# Patient Record
Sex: Male | Born: 1955
Health system: Southern US, Community
[De-identification: ages and names within clinical notes are randomized; demographics above are authoritative.]

## PROBLEM LIST (undated history)

## (undated) DIAGNOSIS — L309 Dermatitis, unspecified: Secondary | ICD-10-CM

## (undated) DIAGNOSIS — I429 Cardiomyopathy, unspecified: Secondary | ICD-10-CM

## (undated) DIAGNOSIS — I10 Essential (primary) hypertension: Secondary | ICD-10-CM

## (undated) DIAGNOSIS — I2510 Atherosclerotic heart disease of native coronary artery without angina pectoris: Secondary | ICD-10-CM

## (undated) DIAGNOSIS — M1990 Unspecified osteoarthritis, unspecified site: Secondary | ICD-10-CM

## (undated) HISTORY — PX: HAND SURGERY: SHX662

## (undated) HISTORY — PX: KNEE SURGERY: SHX244

## (undated) HISTORY — DX: Atherosclerotic heart disease of native coronary artery without angina pectoris: I25.10

## (undated) HISTORY — DX: Cardiomyopathy, unspecified: I42.9

---

## 2002-10-27 ENCOUNTER — Ambulatory Visit (HOSPITAL_COMMUNITY): Admission: RE | Admit: 2002-10-27 | Discharge: 2002-10-27 | Payer: Self-pay | Admitting: Orthopedic Surgery

## 2002-10-27 ENCOUNTER — Encounter: Payer: Self-pay | Admitting: Orthopedic Surgery

## 2004-02-18 ENCOUNTER — Ambulatory Visit (HOSPITAL_COMMUNITY): Admission: RE | Admit: 2004-02-18 | Discharge: 2004-02-18 | Payer: Self-pay | Admitting: Internal Medicine

## 2004-02-22 ENCOUNTER — Ambulatory Visit (HOSPITAL_COMMUNITY): Admission: RE | Admit: 2004-02-22 | Discharge: 2004-02-22 | Payer: Self-pay | Admitting: Internal Medicine

## 2019-12-26 ENCOUNTER — Other Ambulatory Visit: Payer: Self-pay

## 2019-12-26 ENCOUNTER — Ambulatory Visit: Payer: Medicare Other | Attending: Internal Medicine

## 2019-12-26 DIAGNOSIS — Z20822 Contact with and (suspected) exposure to covid-19: Secondary | ICD-10-CM | POA: Insufficient documentation

## 2019-12-27 LAB — NOVEL CORONAVIRUS, NAA: SARS-CoV-2, NAA: NOT DETECTED

## 2019-12-29 ENCOUNTER — Other Ambulatory Visit: Payer: Self-pay

## 2019-12-29 ENCOUNTER — Inpatient Hospital Stay (HOSPITAL_COMMUNITY)
Admission: EM | Admit: 2019-12-29 | Discharge: 2020-01-01 | DRG: 308 | Disposition: A | Discharge status: Home / Self Care | Payer: Medicare Other | Source: Ambulatory Visit | Attending: Internal Medicine | Admitting: Internal Medicine

## 2019-12-29 ENCOUNTER — Emergency Department (HOSPITAL_COMMUNITY): Payer: Medicare Other

## 2019-12-29 ENCOUNTER — Encounter (HOSPITAL_COMMUNITY): Payer: Self-pay | Admitting: Emergency Medicine

## 2019-12-29 DIAGNOSIS — I4892 Unspecified atrial flutter: Secondary | ICD-10-CM | POA: Diagnosis present

## 2019-12-29 DIAGNOSIS — IMO0002 Reserved for concepts with insufficient information to code with codable children: Secondary | ICD-10-CM

## 2019-12-29 DIAGNOSIS — I10 Essential (primary) hypertension: Secondary | ICD-10-CM

## 2019-12-29 DIAGNOSIS — Q211 Atrial septal defect: Secondary | ICD-10-CM

## 2019-12-29 DIAGNOSIS — Z79899 Other long term (current) drug therapy: Secondary | ICD-10-CM

## 2019-12-29 DIAGNOSIS — R918 Other nonspecific abnormal finding of lung field: Secondary | ICD-10-CM | POA: Diagnosis present

## 2019-12-29 DIAGNOSIS — L309 Dermatitis, unspecified: Secondary | ICD-10-CM | POA: Diagnosis present

## 2019-12-29 DIAGNOSIS — I110 Hypertensive heart disease with heart failure: Secondary | ICD-10-CM | POA: Diagnosis present

## 2019-12-29 DIAGNOSIS — I429 Cardiomyopathy, unspecified: Secondary | ICD-10-CM | POA: Diagnosis present

## 2019-12-29 DIAGNOSIS — Z20822 Contact with and (suspected) exposure to covid-19: Secondary | ICD-10-CM | POA: Diagnosis present

## 2019-12-29 DIAGNOSIS — Z8249 Family history of ischemic heart disease and other diseases of the circulatory system: Secondary | ICD-10-CM

## 2019-12-29 DIAGNOSIS — Z6841 Body Mass Index (BMI) 40.0 and over, adult: Secondary | ICD-10-CM

## 2019-12-29 DIAGNOSIS — I2510 Atherosclerotic heart disease of native coronary artery without angina pectoris: Secondary | ICD-10-CM | POA: Diagnosis present

## 2019-12-29 DIAGNOSIS — I483 Typical atrial flutter: Secondary | ICD-10-CM | POA: Diagnosis not present

## 2019-12-29 DIAGNOSIS — Z87891 Personal history of nicotine dependence: Secondary | ICD-10-CM

## 2019-12-29 DIAGNOSIS — I4891 Unspecified atrial fibrillation: Secondary | ICD-10-CM

## 2019-12-29 DIAGNOSIS — I5021 Acute systolic (congestive) heart failure: Secondary | ICD-10-CM | POA: Diagnosis present

## 2019-12-29 DIAGNOSIS — I700 Atherosclerosis of aorta: Secondary | ICD-10-CM | POA: Diagnosis present

## 2019-12-29 DIAGNOSIS — I340 Nonrheumatic mitral (valve) insufficiency: Secondary | ICD-10-CM | POA: Diagnosis present

## 2019-12-29 HISTORY — DX: Unspecified osteoarthritis, unspecified site: M19.90

## 2019-12-29 HISTORY — DX: Essential (primary) hypertension: I10

## 2019-12-29 HISTORY — DX: Dermatitis, unspecified: L30.9

## 2019-12-29 LAB — BRAIN NATRIURETIC PEPTIDE: B Natriuretic Peptide: 34 pg/mL (ref 0.0–100.0)

## 2019-12-29 LAB — HEPATIC FUNCTION PANEL
ALT: 37 U/L (ref 0–44)
AST: 36 U/L (ref 15–41)
Albumin: 4.1 g/dL (ref 3.5–5.0)
Alkaline Phosphatase: 51 U/L (ref 38–126)
Bilirubin, Direct: 0.2 mg/dL (ref 0.0–0.2)
Indirect Bilirubin: 0.7 mg/dL (ref 0.3–0.9)
Total Bilirubin: 0.9 mg/dL (ref 0.3–1.2)
Total Protein: 7.1 g/dL (ref 6.5–8.1)

## 2019-12-29 LAB — CBC
HCT: 43.7 % (ref 39.0–52.0)
Hemoglobin: 13.9 g/dL (ref 13.0–17.0)
MCH: 29.6 pg (ref 26.0–34.0)
MCHC: 31.8 g/dL (ref 30.0–36.0)
MCV: 93.2 fL (ref 80.0–100.0)
Platelets: 271 10*3/uL (ref 150–400)
RBC: 4.69 MIL/uL (ref 4.22–5.81)
RDW: 13.2 % (ref 11.5–15.5)
WBC: 9.6 10*3/uL (ref 4.0–10.5)
nRBC: 0 % (ref 0.0–0.2)

## 2019-12-29 LAB — BASIC METABOLIC PANEL
Anion gap: 9 (ref 5–15)
BUN: 16 mg/dL (ref 8–23)
CO2: 23 mmol/L (ref 22–32)
Calcium: 9.4 mg/dL (ref 8.9–10.3)
Chloride: 105 mmol/L (ref 98–111)
Creatinine, Ser: 0.9 mg/dL (ref 0.61–1.24)
GFR calc Af Amer: >60 mL/min (ref >60)
GFR calc non Af Amer: >60 mL/min (ref >60)
Glucose, Bld: 105 mg/dL — ABNORMAL HIGH (ref 70–99)
Potassium: 4.5 mmol/L (ref 3.5–5.1)
Sodium: 137 mmol/L (ref 135–145)

## 2019-12-29 LAB — D-DIMER, QUANTITATIVE: D-Dimer, Quant: 1.47 ug/mL-FEU — ABNORMAL HIGH (ref 0.00–0.50)

## 2019-12-29 LAB — RESPIRATORY PANEL BY RT PCR (FLU A&B, COVID)
Influenza A by PCR: NEGATIVE
Influenza B by PCR: NEGATIVE
SARS Coronavirus 2 by RT PCR: NEGATIVE

## 2019-12-29 LAB — TROPONIN I (HIGH SENSITIVITY)
Troponin I (High Sensitivity): 19 ng/L — ABNORMAL HIGH (ref <18)
Troponin I (High Sensitivity): 26 ng/L — ABNORMAL HIGH (ref <18)

## 2019-12-29 MED ORDER — SODIUM CHLORIDE 0.9 % IV SOLN: 250.0000 mL | INTRAVENOUS | Status: DC | PRN

## 2019-12-29 MED ORDER — SODIUM CHLORIDE 0.9% FLUSH: 3.0000 mL | INTRAVENOUS | Status: DC | PRN

## 2019-12-29 MED ORDER — ACETAMINOPHEN 325 MG PO TABS: 650.0000 mg | ORAL_TABLET | Freq: Four times a day (QID) | ORAL | Status: DC | PRN

## 2019-12-29 MED ORDER — ACETAMINOPHEN 650 MG RE SUPP: 650.0000 mg | Freq: Four times a day (QID) | RECTAL | Status: DC | PRN

## 2019-12-29 MED ORDER — SODIUM CHLORIDE 0.9% FLUSH
3.0000 mL | Freq: Two times a day (BID) | INTRAVENOUS | Status: DC
  Administered 2019-12-30 – 2019-12-31 (×4): 3 mL via INTRAVENOUS

## 2019-12-29 MED ORDER — METOPROLOL TARTRATE 5 MG/5ML IV SOLN
5.0000 mg | Freq: Once | INTRAVENOUS | Status: AC
  Administered 2019-12-29: 5 mg via INTRAVENOUS
  Filled 2019-12-29: qty 5

## 2019-12-29 MED ORDER — DILTIAZEM HCL 25 MG/5ML IV SOLN
10.0000 mg | Freq: Once | INTRAVENOUS | Status: AC
  Administered 2019-12-29: 10 mg via INTRAVENOUS

## 2019-12-29 MED ORDER — DILTIAZEM HCL-DEXTROSE 125-5 MG/125ML-% IV SOLN (PREMIX)
5.0000 mg/h | INTRAVENOUS | Status: DC
  Administered 2019-12-29: 5 mg/h via INTRAVENOUS
  Administered 2019-12-30 – 2019-12-31 (×4): 15 mg/h via INTRAVENOUS
  Filled 2019-12-29 (×6): qty 125

## 2019-12-29 MED ORDER — DILTIAZEM LOAD VIA INFUSION
10.0000 mg | Freq: Once | INTRAVENOUS | Status: AC
  Administered 2019-12-29: 10 mg via INTRAVENOUS
  Filled 2019-12-29: qty 10

## 2019-12-29 MED ORDER — DILTIAZEM HCL-DEXTROSE 125-5 MG/125ML-% IV SOLN (PREMIX)
5.0000 mg/h | INTRAVENOUS | Status: DC
  Administered 2019-12-29: 22:00:00 5 mg/h via INTRAVENOUS
  Filled 2019-12-29: qty 125

## 2019-12-29 MED ORDER — METOPROLOL TARTRATE 5 MG/5ML IV SOLN
5.0000 mg | Freq: Once | INTRAVENOUS | Status: AC
  Administered 2019-12-29: 22:00:00 5 mg via INTRAVENOUS
  Filled 2019-12-29: qty 5

## 2019-12-29 NOTE — ED Notes (Signed)
Patient transported to X-ray 

## 2019-12-29 NOTE — ED Notes (Signed)
Dr Estell Harpin made aware of pt vital signs. Will hold cardizem at this time. Tonya, NT to get portable ECG

## 2019-12-29 NOTE — ED Triage Notes (Signed)
Pt denies chest pain but reports elevated heart rate x28 days. Pt reports shortness of breath with exertion. Pt sent over from pcp office for elevated heart rate and new onset atrial flutter.

## 2019-12-29 NOTE — H&P (Signed)
TRH H&P    Patient Demographics:    Danny Mccormick, is a 64 y.o. male  MRN: [id number]  DOB - January 26, 1956  Admit Date - 12/29/2019  Referring MD/NP/PA:  Catalina Pizza  Outpatient Primary MD for the patient is Asencion Noble, MD  Patient coming from:  home  Chief complaint- Aflutter   HPI:    Danny Mccormick  is a 64 y.o. male,  w hypertension, apparently presented to pcp for elevated hr x 4 weeks, and slightly dyspnea with exertion, and found to be in [place] at 140 and sent to ER for evaluation. Pt denies fever, chills, cough, cp, palp, n/v, abd pain, diarrhea, brbpr, black stool, dysuria.    In ED  T 99.2, P 139, R 24, Bp 169/123, pox 99% on RA Wt 127kg  CXR IMPRESSION: 1. Several small nodular densities in the lungs. These are nonspecific and could be of infectious or inflammatory etiology. However, the possibility of metastatic disease is not excluded. At the very least, follow-up standing PA and lateral chest radiograph is recommended in 2-3 weeks to ensure the stability or resolution of these findings. If of immediate clinical concern, this could be further evaluated with chest CT.  Na 137, K 4.5,  Bun 16, Creatinine 0.90 Ast 36, Alt 37  Wbc 9.6, HGb 13.9, Plt 271 Trop 19 BNP 34  Sars coronavirus 2 pcr negative  Pt will be admitted for Aflutter with RVR, and elevated troponin, and abnormal CXR     Review of systems:    In addition to the HPI above,  No Fever-chills, No Headache, No changes with Vision or hearing, No problems swallowing food or Liquids, No Chest pain, No Cough , No Abdominal pain, No Nausea or Vomiting, bowel movements are regular, No Blood in stool or Urine, No dysuria, No new skin rashes or bruises, No new joints pains-aches,  No new weakness, tingling, numbness in any extremity, No recent weight gain or loss, No polyuria, polydypsia or polyphagia, No  significant Mental Stressors.  All other systems reviewed and are negative.    Past History of the following :    Past Medical History:  Diagnosis Date  . Arthritis   . Eczema   . Hypertension       Past Surgical History:  Procedure Laterality Date  . KNEE SURGERY        Social History:      Social History   Tobacco Use  . Smoking status: Former Research scientist (life sciences)  . Smokeless tobacco: Never Used  Substance Use Topics  . Alcohol use: Yes    Comment: occ       Family History :    History reviewed. No pertinent family history.  pt can't recall any health problems with mother or father.    Home Medications:   Prior to Admission medications   Not on File     Allergies:    Not on File   Physical Exam:   Vitals  Blood pressure 111/86, pulse 63, temperature 99.2 F (37.3 C), temperature source Oral, resp.  rate (!) 22, height 5' 6.75" (1.695 m), weight 127 kg, SpO2 96 %.  1.  General: axoxo3  2. Psychiatric: Euthymic  3. Neurologic: Cn 2-12 intact, reflexes 2+ symmetric, direct, consensual, near intact, motor 5/5 in all 4 ext  4. HEENMT:  Anicteric, pupils 1.74mm symmetric, direct, consensual intact Neck: no jvd  5. Respiratory : CTAB  6. Cardiovascular : Tachy s1, s2,   7. Gastrointestinal:  Abd: soft, nt, nd, +bs  8. Skin:  Ext: no c/c/e, no rash  9.Musculoskeletal:  Good ROM    Data Review:    CBC Recent Labs  Lab 12/29/19 1643  WBC 9.6  HGB 13.9  HCT 43.7  PLT 271  MCV 93.2  MCH 29.6  MCHC 31.8  RDW 13.2   ------------------------------------------------------------------------------------------------------------------  Results for orders placed or performed during the hospital encounter of 12/29/19 (from the past 48 hour(s))  Basic metabolic panel     Status: Abnormal   Collection Time: 12/29/19  4:43 PM  Result Value Ref Range   Sodium 137 135 - 145 mmol/L   Potassium 4.5 3.5 - 5.1 mmol/L   Chloride 105 98 - 111 mmol/L    CO2 23 22 - 32 mmol/L   Glucose, Bld 105 (H) 70 - 99 mg/dL   BUN 16 8 - 23 mg/dL   Creatinine, Ser 6.43 0.61 - 1.24 mg/dL   Calcium 9.4 8.9 - 32.9 mg/dL   GFR calc non Af Amer >60 >60 mL/min   GFR calc Af Amer >60 >60 mL/min   Anion gap 9 5 - 15    Comment: Performed at [facility], [address] [place], [place] [address]  CBC     Status: None   Collection Time: 12/29/19  4:43 PM  Result Value Ref Range   WBC 9.6 4.0 - 10.5 K/uL   RBC 4.69 4.22 - 5.81 MIL/uL   Hemoglobin 13.9 13.0 - 17.0 g/dL   HCT 16.6 06.3 - 01.6 %   MCV 93.2 80.0 - 100.0 fL   MCH 29.6 26.0 - 34.0 pg   MCHC 31.8 30.0 - 36.0 g/dL   RDW 01.0 93.2 - 35.5 %   Platelets 271 150 - 400 K/uL   nRBC 0.0 0.0 - 0.2 %    Comment: Performed at [facility], [address], [place], [place] [address]  Troponin I (High Sensitivity)     Status: Abnormal   Collection Time: 12/29/19  4:43 PM  Result Value Ref Range   Troponin I (High Sensitivity) 19 (H) <18 ng/L    Comment: (NOTE) Elevated high sensitivity troponin I (hsTnI) values and significant  changes across serial measurements may suggest ACS but many other  chronic and acute conditions are known to elevate hsTnI results.  Refer to the "Links" section for chest pain algorithms and additional  guidance. Performed at [facility], [address] [place], [place] [address]   Brain natriuretic peptide     Status: None   Collection Time: 12/29/19  4:43 PM  Result Value Ref Range   B Natriuretic Peptide 34.0 0.0 - 100.0 pg/mL    Comment: Performed at [facility], [address], [place], [place] [address]  Hepatic function panel     Status: None   Collection Time: 12/29/19  4:43 PM  Result Value Ref Range   Total Protein 7.1 6.5 - 8.1 g/dL   Albumin 4.1 3.5 - 5.0 g/dL   AST 36 15 - 41 U/L   ALT 37 0 - 44  U/L   Alkaline Phosphatase 51 38 - 126 U/L   Total Bilirubin 0.9 0.3 - 1.2 mg/dL   Bilirubin, Direct 0.2 0.0 - 0.2 mg/dL   Indirect Bilirubin 0.7 0.3 - 0.9 mg/dL      Comment: Performed at [facility], [address] [place], [place] [address]  D-dimer, quantitative (not at [facility])     Status: Abnormal   Collection Time: 12/29/19  4:43 PM  Result Value Ref Range   D-Dimer, Quant 1.47 (H) 0.00 - 0.50 ug/mL-FEU    Comment: (NOTE) At the manufacturer cut-off of 0.50 ug/mL FEU, this assay has been documented to exclude PE with a sensitivity and negative predictive value of 97 to 99%.  At this time, this assay has not been approved by the FDA to exclude DVT/VTE. Results should be correlated with clinical presentation. Performed at [facility], [address] [place], [place] [address]   Respiratory Panel by RT PCR (Flu A&B, Covid) - Nasopharyngeal Swab     Status: None   Collection Time: 12/29/19  9:32 PM   Specimen: Nasopharyngeal Swab  Result Value Ref Range   SARS Coronavirus 2 by RT PCR NEGATIVE NEGATIVE    Comment: (NOTE) SARS-CoV-2 target nucleic acids are NOT DETECTED. The SARS-CoV-2 RNA is generally detectable in upper respiratoy specimens during the acute phase of infection. The lowest concentration of SARS-CoV-2 viral copies this assay can detect is 131 copies/mL. A negative result does not preclude SARS-Cov-2 infection and should not be used as the sole basis for treatment or other patient management decisions. A negative result may occur with  improper specimen collection/handling, submission of specimen other than nasopharyngeal swab, presence of viral mutation(s) within the areas targeted by this assay, and inadequate number of viral copies (<131 copies/mL). A negative result must be combined with clinical observations, patient history, and epidemiological information. The expected result is Negative. Fact Sheet for Patients:  https://www.moore.com/https://www.fda.gov/media/142436/download Fact Sheet for Healthcare Providers:  https://www.young.biz/https://www.fda.gov/media/142435/download This test is not yet ap proved or cleared by the [place] FDA and  has been  authorized for detection and/or diagnosis of SARS-CoV-2 by FDA under an Emergency Use Authorization (EUA). This EUA will remain  in effect (meaning this test can be used) for the duration of the COVID-19 declaration under Section 564(b)(1) of the Act, 21 U.S.C. section 360bbb-3(b)(1), unless the authorization is terminated or revoked sooner.    Influenza A by PCR NEGATIVE NEGATIVE   Influenza B by PCR NEGATIVE NEGATIVE    Comment: (NOTE) The Xpert Xpress SARS-CoV-2/FLU/RSV assay is intended as an aid in  the diagnosis of influenza from Nasopharyngeal swab specimens and  should not be used as a sole basis for treatment. Nasal washings and  aspirates are unacceptable for Xpert Xpress SARS-CoV-2/FLU/RSV  testing. Fact Sheet for Patients: https://www.moore.com/https://www.fda.gov/media/142436/download Fact Sheet for Healthcare Providers: https://www.young.biz/https://www.fda.gov/media/142435/download This test is not yet approved or cleared by the [place] FDA and  has been authorized for detection and/or diagnosis of SARS-CoV-2 by  FDA under an Emergency Use Authorization (EUA). This EUA will remain  in effect (meaning this test can be used) for the duration of the  Covid-19 declaration under Section 564(b)(1) of the Act, 21  U.S.C. section 360bbb-3(b)(1), unless the authorization is  terminated or revoked. Performed at [facility], [address] [place], [place] [address]   Troponin I (High Sensitivity)     Status: Abnormal   Collection Time: 12/29/19 10:17 PM  Result Value Ref Range   Troponin I (High Sensitivity)  26 (H) <18 ng/L    Comment: (NOTE) Elevated high sensitivity troponin I (hsTnI) values and significant  changes across serial measurements may suggest ACS but many other  chronic and acute conditions are known to elevate hsTnI results.  Refer to the "Links" section for chest pain algorithms and additional  guidance. Performed at [facility], [address] [place], [place] [address]     Chemistries    Recent Labs  Lab 12/29/19 1643  NA 137  K 4.5  CL 105  CO2 23  GLUCOSE 105*  BUN 16  CREATININE 0.90  CALCIUM 9.4  AST 36  ALT 37  ALKPHOS 51  BILITOT 0.9   ------------------------------------------------------------------------------------------------------------------  ------------------------------------------------------------------------------------------------------------------ GFR: Estimated Creatinine Clearance: 104.3 mL/min (by C-G formula based on SCr of 0.9 mg/dL). Liver Function Tests: Recent Labs  Lab 12/29/19 1643  AST 36  ALT 37  ALKPHOS 51  BILITOT 0.9  PROT 7.1  ALBUMIN 4.1   No results for input(s): LIPASE, AMYLASE in the last 168 hours. No results for input(s): AMMONIA in the last 168 hours. Coagulation Profile: No results for input(s): INR, PROTIME in the last 168 hours. Cardiac Enzymes: No results for input(s): CKTOTAL, CKMB, CKMBINDEX, TROPONINI in the last 168 hours. BNP (last 3 results) No results for input(s): PROBNP in the last 8760 hours. HbA1C: No results for input(s): HGBA1C in the last 72 hours. CBG: No results for input(s): GLUCAP in the last 168 hours. Lipid Profile: No results for input(s): CHOL, HDL, LDLCALC, TRIG, CHOLHDL, LDLDIRECT in the last 72 hours. Thyroid Function Tests: No results for input(s): TSH, T4TOTAL, FREET4, T3FREE, THYROIDAB in the last 72 hours. Anemia Panel: No results for input(s): VITAMINB12, FOLATE, FERRITIN, TIBC, IRON, RETICCTPCT in the last 72 hours.  --------------------------------------------------------------------------------------------------------------- Urine analysis: No results found for: COLORURINE, APPEARANCEUR, LABSPEC, PHURINE, GLUCOSEU, HGBUR, BILIRUBINUR, KETONESUR, PROTEINUR, UROBILINOGEN, NITRITE, LEUKOCYTESUR    Imaging Results:    DG Chest 2 View  Result Date: 12/29/2019 CLINICAL DATA:  64 year old male with history of tachycardia and shortness of breath. EXAM: CHEST - 2 VIEW  COMPARISON:  Chest x-ray 02/18/2004. FINDINGS: Several small nodular densities are noted in the lungs, most evident in the right mid lung. No consolidative airspace disease. Trace bilateral pleural effusions. No pneumothorax. No evidence of pulmonary edema. Heart size appears borderline enlarged. Upper mediastinal contours are within normal limits. Aortic atherosclerosis. IMPRESSION: 1. Several small nodular densities in the lungs. These are nonspecific and could be of infectious or inflammatory etiology. However, the possibility of metastatic disease is not excluded. At the very least, follow-up standing PA and lateral chest radiograph is recommended in 2-3 weeks to ensure the stability or resolution of these findings. If of immediate clinical concern, this could be further evaluated with chest CT. Electronically Signed   By: Trudie Reed M.D.   On: 12/29/2019 18:15   Aflutter at 125, ED states reviewed with cardiology fellow who recommended medicine admission.      Assessment & Plan:    Principal Problem:   Atrial flutter with rapid ventricular response (HCC)  Aflutter with RVR Tele Trop I  D dimer, if positive then CTA chest r/o PE Check TSH Check cardiac echo cardizem gtt Please consult cardiology in Am, appreciate input  Abnormal CXR Will require f/u   DVT Prophylaxis-   Lovenox - SCDs   AM Labs Ordered, also please review Full Orders  Family Communication: Admission, patients condition and plan of care including tests being ordered have been discussed with the patient  who indicate understanding and agree with the plan and Code Status.  Code Status:  FULL CODE per patient  Admission status: Observation: Based on patients clinical presentation and evaluation of above clinical data, I have made determination that patient meets Observation criteria at this time. Pt may require inpatient stay depending upon how his Aflutter w/up proceeds  Time spent in minutes : 55 minutes  critical care   Pearson Grippe M.D on 12/30/2019 at 1:55 AM

## 2019-12-29 NOTE — ED Provider Notes (Signed)
[facility] EMERGENCY DEPARTMENT Provider Note   CSN: [id number] Arrival date & time: 12/29/19  1524     History Chief Complaint  Patient presents with  . Tachycardia    Danny Mccormick is a 64 y.o. male.  Patient presents with shortness of breath for a month.  He went to his family doctor and they did an EKG that shows heart rate was 140 and atrial flutter.  The history is provided by the patient. No language interpreter was used.  Shortness of Breath Severity:  Moderate Onset quality:  Gradual Timing:  Constant Progression:  Waxing and waning Chronicity:  New Context: activity   Relieved by:  Nothing Worsened by:  Nothing Associated symptoms: no abdominal pain, no chest pain, no cough, no headaches and no rash        Past Medical History:  Diagnosis Date  . Arthritis   . Eczema   . Hypertension     Patient Active Problem List   Diagnosis Date Noted  . Atrial flutter with rapid ventricular response (HCC) 12/29/2019    Past Surgical History:  Procedure Laterality Date  . KNEE SURGERY         History reviewed. No pertinent family history.  Social History   Tobacco Use  . Smoking status: Former Games developer  . Smokeless tobacco: Never Used  Substance Use Topics  . Alcohol use: Yes    Comment: occ  . Drug use: Never    Home Medications Prior to Admission medications   Not on File    Allergies    Patient has no allergy information on record.  Review of Systems   Review of Systems  Constitutional: Negative for appetite change and fatigue.  HENT: Negative for congestion, ear discharge and sinus pressure.   Eyes: Negative for discharge.  Respiratory: Positive for shortness of breath. Negative for cough.   Cardiovascular: Negative for chest pain.  Gastrointestinal: Negative for abdominal pain and diarrhea.  Genitourinary: Negative for frequency and hematuria.  Musculoskeletal: Negative for back pain.  Skin: Negative for rash.  Neurological:  Negative for seizures and headaches.  Psychiatric/Behavioral: Negative for hallucinations.    Physical Exam Updated Vital Signs BP (!) 151/117   Pulse (!) 115   Temp 99.2 F (37.3 C) (Oral)   Resp (!) 22   Ht 5' 6.75" (1.695 m)   Wt 127 kg   SpO2 95%   BMI 44.18 kg/m   Physical Exam Vitals and nursing note reviewed.  Constitutional:      Appearance: He is well-developed.  HENT:     Head: Normocephalic.     Nose: Nose normal.  Eyes:     General: No scleral icterus.    Conjunctiva/sclera: Conjunctivae normal.  Neck:     Thyroid: No thyromegaly.  Cardiovascular:     Rate and Rhythm: Regular rhythm.     Heart sounds: No murmur. No friction rub. No gallop.      Comments: Rapid rate Pulmonary:     Breath sounds: No stridor. No wheezing or rales.  Chest:     Chest wall: No tenderness.  Abdominal:     General: There is no distension.     Tenderness: There is no abdominal tenderness. There is no rebound.  Musculoskeletal:        General: Normal range of motion.     Cervical back: Neck supple.  Lymphadenopathy:     Cervical: No cervical adenopathy.  Skin:    Findings: No erythema or rash.  Neurological:  Mental Status: He is oriented to person, place, and time.     Motor: No abnormal muscle tone.     Coordination: Coordination normal.  Psychiatric:        Behavior: Behavior normal.     ED Results / Procedures / Treatments   Labs (all labs ordered are listed, but only abnormal results are displayed) Labs Reviewed  BASIC METABOLIC PANEL - Abnormal; Notable for the following components:      Result Value   Glucose, Bld 105 (*)    All other components within normal limits  TROPONIN I (HIGH SENSITIVITY) - Abnormal; Notable for the following components:   Troponin I (High Sensitivity) 19 (*)    All other components within normal limits  RESPIRATORY PANEL BY RT PCR (FLU A&B, COVID)  CBC  BRAIN NATRIURETIC PEPTIDE  HEPATIC FUNCTION PANEL  HIV ANTIBODY (ROUTINE  TESTING W REFLEX)  COMPREHENSIVE METABOLIC PANEL  CBC  TSH  TROPONIN I (HIGH SENSITIVITY)    EKG EKG Interpretation  Date/Time:  Friday December 29 2019 16:21:39 EST Ventricular Rate:  139 PR Interval:    QRS Duration: 70 QT Interval:  354 QTC Calculation: 538 R Axis:   56 Text Interpretation: Atrial flutter with 2:1 A-V conduction Marked ST abnormality, possible inferior subendocardial injury Abnormal ECG Confirmed by Milton Ferguson [id number]) on 12/29/2019 8:22:13 PM   Radiology DG Chest 2 View  Result Date: 12/29/2019 CLINICAL DATA:  64 year old male with history of tachycardia and shortness of breath. EXAM: CHEST - 2 VIEW COMPARISON:  Chest x-ray 02/18/2004. FINDINGS: Several small nodular densities are noted in the lungs, most evident in the right mid lung. No consolidative airspace disease. Trace bilateral pleural effusions. No pneumothorax. No evidence of pulmonary edema. Heart size appears borderline enlarged. Upper mediastinal contours are within normal limits. Aortic atherosclerosis. IMPRESSION: 1. Several small nodular densities in the lungs. These are nonspecific and could be of infectious or inflammatory etiology. However, the possibility of metastatic disease is not excluded. At the very least, follow-up standing PA and lateral chest radiograph is recommended in 2-3 weeks to ensure the stability or resolution of these findings. If of immediate clinical concern, this could be further evaluated with chest CT. Electronically Signed   By: Vinnie Langton M.D.   On: 12/29/2019 18:15    Procedures Procedures (including critical care time)  Medications Ordered in ED Medications  acetaminophen (TYLENOL) tablet 650 mg (has no administration in time range)    Or  acetaminophen (TYLENOL) suppository 650 mg (has no administration in time range)  sodium chloride flush (NS) 0.9 % injection 3 mL (has no administration in time range)  sodium chloride flush (NS) 0.9 % injection 3 mL (has  no administration in time range)  0.9 %  sodium chloride infusion (has no administration in time range)  diltiazem (CARDIZEM) 1 mg/mL load via infusion 10 mg (has no administration in time range)    And  diltiazem (CARDIZEM) 125 mg in dextrose 5% 125 mL (1 mg/mL) infusion (has no administration in time range)  diltiazem (CARDIZEM) injection 10 mg (10 mg Intravenous Given 12/29/19 2133)  metoprolol tartrate (LOPRESSOR) injection 5 mg (5 mg Intravenous Given 12/29/19 2147)  metoprolol tartrate (LOPRESSOR) injection 5 mg (5 mg Intravenous Given 12/29/19 2203)    ED Course  I have reviewed the triage vital signs and the nursing notes.  Pertinent labs & imaging results that were available during my care of the patient were reviewed by me and considered in my medical decision  making (see chart for details).    CRITICAL CARE Performed by: Bethann Berkshire Total critical care time: 40 minutes Critical care time was exclusive of separately billable procedures and treating other patients. Critical care was necessary to treat or prevent imminent or life-threatening deterioration. Critical care was time spent personally by me on the following activities: development of treatment plan with patient and/or surrogate as well as nursing, discussions with consultants, evaluation of patient's response to treatment, examination of patient, obtaining history from patient or surrogate, ordering and performing treatments and interventions, ordering and review of laboratory studies, ordering and review of radiographic studies, pulse oximetry and re-evaluation of patient's condition.  MDM Rules/Calculators/A&P                      EKG shows rapid atrial flutter around 140.  Patient was given Cardizem without any help and then given Lopressor 5 mg twice and his rate came down to about 120 from 140.  I spoke with cardiology and they recommended admission to the hospital  and placed on metoprolol 6.25 mg 4 times a day  p.o. Final Clinical Impression(s) / ED Diagnoses Final diagnoses:  Flutter-fibrillation [facility])    Rx / DC Orders ED Discharge Orders    None       Bethann Berkshire, MD 12/29/19 2244

## 2019-12-29 NOTE — ED Notes (Addendum)
Pt ambulatory to bathroom and back to hallway bed. Pt says he "is feeling better".  Pt had steady gait with no reports of dizziness.

## 2019-12-30 ENCOUNTER — Observation Stay (HOSPITAL_COMMUNITY): Payer: Medicare Other

## 2019-12-30 ENCOUNTER — Encounter (HOSPITAL_COMMUNITY): Payer: Self-pay | Admitting: Internal Medicine

## 2019-12-30 DIAGNOSIS — I5021 Acute systolic (congestive) heart failure: Secondary | ICD-10-CM | POA: Diagnosis not present

## 2019-12-30 DIAGNOSIS — Z6841 Body Mass Index (BMI) 40.0 and over, adult: Secondary | ICD-10-CM | POA: Diagnosis not present

## 2019-12-30 DIAGNOSIS — I340 Nonrheumatic mitral (valve) insufficiency: Secondary | ICD-10-CM | POA: Diagnosis present

## 2019-12-30 DIAGNOSIS — Z20822 Contact with and (suspected) exposure to covid-19: Secondary | ICD-10-CM | POA: Diagnosis present

## 2019-12-30 DIAGNOSIS — I700 Atherosclerosis of aorta: Secondary | ICD-10-CM | POA: Diagnosis present

## 2019-12-30 DIAGNOSIS — L309 Dermatitis, unspecified: Secondary | ICD-10-CM | POA: Diagnosis present

## 2019-12-30 DIAGNOSIS — I4891 Unspecified atrial fibrillation: Secondary | ICD-10-CM | POA: Diagnosis present

## 2019-12-30 DIAGNOSIS — I2510 Atherosclerotic heart disease of native coronary artery without angina pectoris: Secondary | ICD-10-CM | POA: Diagnosis present

## 2019-12-30 DIAGNOSIS — Z8249 Family history of ischemic heart disease and other diseases of the circulatory system: Secondary | ICD-10-CM | POA: Diagnosis not present

## 2019-12-30 DIAGNOSIS — I10 Essential (primary) hypertension: Secondary | ICD-10-CM | POA: Diagnosis not present

## 2019-12-30 DIAGNOSIS — I483 Typical atrial flutter: Secondary | ICD-10-CM | POA: Diagnosis not present

## 2019-12-30 DIAGNOSIS — I4892 Unspecified atrial flutter: Secondary | ICD-10-CM | POA: Diagnosis present

## 2019-12-30 DIAGNOSIS — I110 Hypertensive heart disease with heart failure: Secondary | ICD-10-CM | POA: Diagnosis present

## 2019-12-30 DIAGNOSIS — I361 Nonrheumatic tricuspid (valve) insufficiency: Secondary | ICD-10-CM | POA: Diagnosis not present

## 2019-12-30 DIAGNOSIS — Q211 Atrial septal defect: Secondary | ICD-10-CM | POA: Diagnosis not present

## 2019-12-30 DIAGNOSIS — R918 Other nonspecific abnormal finding of lung field: Secondary | ICD-10-CM | POA: Diagnosis present

## 2019-12-30 DIAGNOSIS — Z79899 Other long term (current) drug therapy: Secondary | ICD-10-CM | POA: Diagnosis not present

## 2019-12-30 DIAGNOSIS — Z87891 Personal history of nicotine dependence: Secondary | ICD-10-CM | POA: Diagnosis not present

## 2019-12-30 DIAGNOSIS — I429 Cardiomyopathy, unspecified: Secondary | ICD-10-CM | POA: Diagnosis present

## 2019-12-30 LAB — COMPREHENSIVE METABOLIC PANEL
ALT: 38 U/L (ref 0–44)
AST: 37 U/L (ref 15–41)
Albumin: 3.9 g/dL (ref 3.5–5.0)
Alkaline Phosphatase: 52 U/L (ref 38–126)
Anion gap: 13 (ref 5–15)
BUN: 15 mg/dL (ref 8–23)
CO2: 22 mmol/L (ref 22–32)
Calcium: 9.6 mg/dL (ref 8.9–10.3)
Chloride: 102 mmol/L (ref 98–111)
Creatinine, Ser: 0.83 mg/dL (ref 0.61–1.24)
GFR calc Af Amer: >60 mL/min (ref >60)
GFR calc non Af Amer: >60 mL/min (ref >60)
Glucose, Bld: 113 mg/dL — ABNORMAL HIGH (ref 70–99)
Potassium: 4.3 mmol/L (ref 3.5–5.1)
Sodium: 137 mmol/L (ref 135–145)
Total Bilirubin: 1.1 mg/dL (ref 0.3–1.2)
Total Protein: 7 g/dL (ref 6.5–8.1)

## 2019-12-30 LAB — CBC
HCT: 43.4 % (ref 39.0–52.0)
Hemoglobin: 13.7 g/dL (ref 13.0–17.0)
MCH: 29.5 pg (ref 26.0–34.0)
MCHC: 31.6 g/dL (ref 30.0–36.0)
MCV: 93.5 fL (ref 80.0–100.0)
Platelets: 336 10*3/uL (ref 150–400)
RBC: 4.64 MIL/uL (ref 4.22–5.81)
RDW: 13.2 % (ref 11.5–15.5)
WBC: 8.6 10*3/uL (ref 4.0–10.5)
nRBC: 0 % (ref 0.0–0.2)

## 2019-12-30 LAB — ECHOCARDIOGRAM COMPLETE
Height: 66.75 in
Weight: 4480 oz

## 2019-12-30 LAB — HIV ANTIBODY (ROUTINE TESTING W REFLEX): HIV Screen 4th Generation wRfx: NONREACTIVE

## 2019-12-30 LAB — TSH: TSH: 0.79 u[IU]/mL (ref 0.350–4.500)

## 2019-12-30 MED ORDER — APIXABAN 5 MG PO TABS
5.0000 mg | ORAL_TABLET | Freq: Two times a day (BID) | ORAL | Status: DC
  Administered 2019-12-30 – 2020-01-01 (×5): 5 mg via ORAL
  Filled 2019-12-30 (×5): qty 1

## 2019-12-30 MED ORDER — IOHEXOL 350 MG/ML SOLN
100.0000 mL | Freq: Once | INTRAVENOUS | Status: AC | PRN
  Administered 2019-12-30: 04:00:00 100 mL via INTRAVENOUS

## 2019-12-30 NOTE — Progress Notes (Signed)
PROGRESS NOTE  Danny Mccormick RSW:[id number] DOB: 31-Jul-1956 DOA: 12/29/2019 PCP: Carylon Perches, MD  Brief History:  64 year old male with a history of hypertension (not on meds) presenting with 4-week history of shortness of breath and dyspnea on exertion.  He went to see his primary care provider on 12/29/2019.  EKG was done in the office and showed atrial flutter with a heart rate in the 140s.  As result, the patient was sent to the emergency department for further evaluation.  He denies any fevers, chills, chest pain, nausea, vomiting, diarrhea, abdominal pain, cough, hemoptysis. In the ED, the patient was noted to have atrial flutter with RVR with a heart rate of 139.  He had temperature of 99.2 F but was hemodynamically stable with oxygen saturation 98% on room air.  BMP, LFT, CBC were essentially unremarkable.  SARS-CoV2 is neg  Assessment/Plan: Atrial flutter with RVR -Newly diagnosed -Check TSH -Echocardiogram -CHADSVASc= 2 (HTN, Age) -Continue diltiazem drip for now with plan to transition to oral diltiazem if echo shows preserved EF -start apixaban  Essential Hypertension -Patient states that he was diagnosed approximately 5 to 6 years ago, but refused to take any medications.  He opted for lifestyle modification -Upon presentation, blood pressure initially was 152/108 -Now controlled on diltiazem drip  Morbid obesity -BMI 44.1 -Lifestyle modification     Disposition Plan:   Transfer to [facility]  Family Communication:   Family at bedside  Consultants:   cardiology  Code Status:  FULL  DVT Prophylaxis:  apixaban   Procedures: As Listed in Progress Note Above  Antibiotics: None       Subjective: Patient denies fevers, chills, headache, chest pain, dyspnea, nausea, vomiting, diarrhea, abdominal pain, dysuria, hematuria, hematochezia, and melena.   Objective: Vitals:   12/30/19 0330 12/30/19 0400 12/30/19 0500 12/30/19 0600  BP: (!) 126/94 (!)  145/82 (!) 129/91 130/81  Pulse: 65 81 89 90  Resp: (!) 21 (!) 21 (!) 22 19  Temp:      TempSrc:      SpO2: 97% 96% 97% 99%  Weight:      Height:        Intake/Output Summary (Last 24 hours) at 12/30/2019 0723 Last data filed at 12/30/2019 0600 Gross per 24 hour  Intake 105.93 ml  Output --  Net 105.93 ml   Weight change:  Exam:   General:  Pt is alert, follows commands appropriately, not in acute distress  HEENT: No icterus, No thrush, No neck mass, [place]/AT  Cardiovascular: IRRR, S1/S2, no rubs, no gallops  Respiratory: CTA bilaterally, no wheezing, no crackles, no rhonchi  Abdomen: Soft/+BS, non tender, non distended, no guarding  Extremities: trace LE edema, No lymphangitis, No petechiae, No rashes, no synovitis   Data Reviewed: I have personally reviewed following labs and imaging studies Basic Metabolic Panel: Recent Labs  Lab 12/29/19 1643  NA 137  K 4.5  CL 105  CO2 23  GLUCOSE 105*  BUN 16  CREATININE 0.90  CALCIUM 9.4   Liver Function Tests: Recent Labs  Lab 12/29/19 1643  AST 36  ALT 37  ALKPHOS 51  BILITOT 0.9  PROT 7.1  ALBUMIN 4.1   No results for input(s): LIPASE, AMYLASE in the last 168 hours. No results for input(s): AMMONIA in the last 168 hours. Coagulation Profile: No results for input(s): INR, PROTIME in the last 168 hours. CBC: Recent Labs  Lab 12/29/19 1643  WBC 9.6  HGB 13.9  HCT 43.7  MCV 93.2  PLT 271   Cardiac Enzymes: No results for input(s): CKTOTAL, CKMB, CKMBINDEX, TROPONINI in the last 168 hours. BNP: Invalid input(s): POCBNP CBG: No results for input(s): GLUCAP in the last 168 hours. HbA1C: No results for input(s): HGBA1C in the last 72 hours. Urine analysis: No results found for: COLORURINE, APPEARANCEUR, LABSPEC, PHURINE, GLUCOSEU, HGBUR, BILIRUBINUR, KETONESUR, PROTEINUR, UROBILINOGEN, NITRITE, LEUKOCYTESUR Sepsis Labs: @LABRCNTIP (procalcitonin:4,lacticidven:4) ) Recent Results (from the past 240  hour(s))  Novel Coronavirus, NAA ([facility])     Status: None   Collection Time: 12/26/19 10:51 AM   Specimen: Nasopharyngeal(NP) swabs in vial transport medium   NASOPHARYNGE  TESTING  Result Value Ref Range Status   SARS-CoV-2, NAA Not Detected Not Detected Final    Comment: This nucleic acid amplification test was developed and its performance characteristics determined by [employer], [employer] and [employer]. Nucleic acid amplification tests include PCR and TMA. This test has not been FDA cleared or approved. This test has been authorized by FDA under an Emergency Use Authorization (EUA). This test is only authorized for the duration of time the declaration that circumstances exist justifying the authorization of the emergency use of in vitro diagnostic tests for detection of SARS-CoV-2 virus and/or diagnosis of COVID-19 infection under section 564(b)(1) of the Act, 21 U.S.C. 035KKX-3(G) (1), unless the authorization is terminated or revoked sooner. When diagnostic testing is negative, the possibility of a false negative result should be considered in the context of a patient's recent exposures and the presence of clinical signs and symptoms consistent with COVID-19. An individual without symptoms of COVID-19 and who is not shedding SARS-CoV-2 virus would  expect to have a negative (not detected) result in this assay.   Respiratory Panel by RT PCR (Flu A&B, Covid) - Nasopharyngeal Swab     Status: None   Collection Time: 12/29/19  9:32 PM   Specimen: Nasopharyngeal Swab  Result Value Ref Range Status   SARS Coronavirus 2 by RT PCR NEGATIVE NEGATIVE Final    Comment: (NOTE) SARS-CoV-2 target nucleic acids are NOT DETECTED. The SARS-CoV-2 RNA is generally detectable in upper respiratoy specimens during the acute phase of infection. The lowest concentration of SARS-CoV-2 viral copies this assay can detect is 131 copies/mL. A negative result does not preclude SARS-Cov-2 infection and should not be  used as the sole basis for treatment or other patient management decisions. A negative result may occur with  improper specimen collection/handling, submission of specimen other than nasopharyngeal swab, presence of viral mutation(s) within the areas targeted by this assay, and inadequate number of viral copies (<131 copies/mL). A negative result must be combined with clinical observations, patient history, and epidemiological information. The expected result is Negative. Fact Sheet for Patients:  PinkCheek.be Fact Sheet for Healthcare Providers:  GravelBags.it This test is not yet ap proved or cleared by the [place] FDA and  has been authorized for detection and/or diagnosis of SARS-CoV-2 by FDA under an Emergency Use Authorization (EUA). This EUA will remain  in effect (meaning this test can be used) for the duration of the COVID-19 declaration under Section 564(b)(1) of the Act, 21 U.S.C. section 360bbb-3(b)(1), unless the authorization is terminated or revoked sooner.    Influenza A by PCR NEGATIVE NEGATIVE Final   Influenza B by PCR NEGATIVE NEGATIVE Final    Comment: (NOTE) The Xpert Xpress SARS-CoV-2/FLU/RSV assay is intended as an aid in  the diagnosis of influenza from Nasopharyngeal swab specimens and  should not be used  as a sole basis for treatment. Nasal washings and  aspirates are unacceptable for Xpert Xpress SARS-CoV-2/FLU/RSV  testing. Fact Sheet for Patients: https://www.moore.com/ Fact Sheet for Healthcare Providers: https://www.young.biz/ This test is not yet approved or cleared by the [place] FDA and  has been authorized for detection and/or diagnosis of SARS-CoV-2 by  FDA under an Emergency Use Authorization (EUA). This EUA will remain  in effect (meaning this test can be used) for the duration of the  Covid-19 declaration under Section 564(b)(1) of the  Act, 21  U.S.C. section 360bbb-3(b)(1), unless the authorization is  terminated or revoked. Performed at [facility], [address], [place], [place] [address]      Scheduled Meds: . sodium chloride flush  3 mL Intravenous Q12H   Continuous Infusions: . sodium chloride    . diltiazem (CARDIZEM) infusion 15 mg/hr (12/30/19 0600)    Procedures/Studies: DG Chest 2 View  Result Date: 12/29/2019 CLINICAL DATA:  64 year old male with history of tachycardia and shortness of breath. EXAM: CHEST - 2 VIEW COMPARISON:  Chest x-ray 02/18/2004. FINDINGS: Several small nodular densities are noted in the lungs, most evident in the right mid lung. No consolidative airspace disease. Trace bilateral pleural effusions. No pneumothorax. No evidence of pulmonary edema. Heart size appears borderline enlarged. Upper mediastinal contours are within normal limits. Aortic atherosclerosis. IMPRESSION: 1. Several small nodular densities in the lungs. These are nonspecific and could be of infectious or inflammatory etiology. However, the possibility of metastatic disease is not excluded. At the very least, follow-up standing PA and lateral chest radiograph is recommended in 2-3 weeks to ensure the stability or resolution of these findings. If of immediate clinical concern, this could be further evaluated with chest CT. Electronically Signed   By: Trudie Reed M.D.   On: 12/29/2019 18:15   CT ANGIO CHEST PE W OR WO CONTRAST  Result Date: 12/30/2019 CLINICAL DATA:  Elevated D-dimer. Short of breath. EXAM: CT ANGIOGRAPHY CHEST WITH CONTRAST TECHNIQUE: Multidetector CT imaging of the chest was performed using the standard protocol during bolus administration of intravenous contrast. Multiplanar CT image reconstructions and MIPs were obtained to evaluate the vascular anatomy. CONTRAST:  OMNIPAQUE IOHEXOL 350 MG/ML SOLN COMPARISON:  None. FINDINGS: Cardiovascular: No filling defects within the pulmonary arteries  arteries to suggest acute pulmonary embolism. Coronary artery calcification and aortic atherosclerotic calcification. Mediastinum/Nodes: No axillary or supraclavicular adenopathy. No mediastinal hilar adenopathy. No pericardial effusion Lungs/Pleura: No pulmonary infarction. No infiltrate. No pulmonary edema. Subpleural focus of ground-glass density and nodularity in the peripheral RIGHT upper lobe on image 62/2 measures 1 cm. This appears to be a small be a small focus of infection inflammation. Mild basilar atelectasis on the RIGHT and small effusion. Upper Abdomen: Limited view of the liver, kidneys, pancreas are unremarkable. Normal adrenal glands. Musculoskeletal: No aggressive osseous lesion. Review of the MIP images confirms the above findings. IMPRESSION: 1. No evidence acute pulmonary embolism. 2. Small focus of infection inflammation in the RIGHT upper lobe. 3. Small RIGHT effusion. 4. Coronary artery calcification and aortic atherosclerotic calcification. Electronically Signed   By: Genevive Bi M.D.   On: 12/30/2019 04:53    Catarina Hartshorn, DO  [facility] Pager [phone]  If 7PM-7AM, please contact night-coverage [email or web address] Password [email or web address] 12/30/2019, 7:23 AM   LOS: 0 days

## 2019-12-30 NOTE — Progress Notes (Signed)
Echocardiogram 2D Echocardiogram has been performed.  Danny Mccormick 12/30/2019, 11:40 AM

## 2019-12-30 NOTE — Consult Note (Signed)
Cardiology Consult    Patient ID: Danny Mccormick; [id number]; 10/21/1956   Admit date: 12/29/2019 Date of Consult: 12/30/2019  Primary Care Provider: Carylon Perches, MD Primary Cardiologist: New to [facility] - Dr. Royann Shivers  Patient Profile    Danny Mccormick is a 64 y.o. male with past medical history of HTN and no prior cardiac history who is being seen today for the evaluation of new-onset atrial flutter at the request of Dr. Arbutus Leas.   History of Present Illness    Danny Mccormick was evaluated by his PCP yesterday and found to be in atrial flutter, HR in the 140's, therefore he was sent to [facility] ED for further evaluation. He reported worsening dyspnea on exertion for the past 4 weeks but denied any associated chest pain, orthopnea, PND, fever or chills.   Initial labs show WBC 9.6, Hgb 13.9, platelets 271, Na+ 137, K+ 4.5 and creatinine 0.90. Initial HS Troponin 19 with repeat of 26. TSH 0.790. COVID negative. CXR showed several small nodular densities in the lungs which were nonspecific and could be of infectious or inflammatory etiology. CTA showed no evidence of PE but was found to have a small focus on inflammation in the RUL with a small right effusion and coronary artery calcifications and aortic atherosclerosis. EKG shows atrial flutter with RVR, HR 139 with 2:1 conduction.   He has been started on IV Cardizem and is currently on 15 mL/hr. Was started on Eliquis 5mg  BID with first dose this AM. He is currently feeling much improved over the past few hours since his diltiazem has been started. Now that his heart rate is better controlled, his shortness of breath is much better. He says that his brother has a history of atrial fibrillation, but no further arrhythmias in his family. He had progressively been getting more short of breath over the last month, and had also gained up to 50 pounds.  Past Medical History:  Diagnosis Date  . Arthritis   . Eczema   . Hypertension     Past  Surgical History:  Procedure Laterality Date  . KNEE SURGERY       Home Medications:  Prior to Admission medications   Not on File    Inpatient Medications: Scheduled Meds: . apixaban  5 mg Oral BID  . sodium chloride flush  3 mL Intravenous Q12H   Continuous Infusions: . sodium chloride    . diltiazem (CARDIZEM) infusion 15 mg/hr (12/30/19 0600)   PRN Meds: sodium chloride, acetaminophen **OR** acetaminophen, sodium chloride flush  Allergies:   Not on File  Social History:   Social History   Socioeconomic History  . Marital status: Married    Spouse name: Not on file  . Number of children: Not on file  . Years of education: Not on file  . Highest education level: Not on file  Occupational History  . Not on file  Tobacco Use  . Smoking status: Former 01/01/20  . Smokeless tobacco: Never Used  Substance and Sexual Activity  . Alcohol use: Yes    Comment: occ  . Drug use: Never  . Sexual activity: Not on file  Other Topics Concern  . Not on file  Social History Narrative  . Not on file   Social Determinants of Health   Financial Resource Strain:   . Difficulty of Paying Living Expenses: Not on file  Food Insecurity:   . Worried About Games developer in the Last Year: Not  on file  . Ran Out of Food in the Last Year: Not on file  Transportation Needs:   . Lack of Transportation (Medical): Not on file  . Lack of Transportation (Non-Medical): Not on file  Physical Activity:   . Days of Exercise per Week: Not on file  . Minutes of Exercise per Session: Not on file  Stress:   . Feeling of Stress : Not on file  Social Connections:   . Frequency of Communication with Friends and Family: Not on file  . Frequency of Social Gatherings with Friends and Family: Not on file  . Attends Religious Services: Not on file  . Active Member of Clubs or Organizations: Not on file  . Attends Archivist Meetings: Not on file  . Marital Status: Not on file    Intimate Partner Violence:   . Fear of Current or Ex-Partner: Not on file  . Emotionally Abused: Not on file  . Physically Abused: Not on file  . Sexually Abused: Not on file     Family History:    Family History  Problem Relation Age of Onset  . Atrial fibrillation Brother       Review of Systems    General:  No chills, fever, night sweats or weight changes.  Cardiovascular:  No chest pain, edema, orthopnea, palpitations, paroxysmal nocturnal dyspnea. Positive for dyspnea on exertion.  Dermatological: No rash, lesions/masses Respiratory: No cough, dyspnea Urologic: No hematuria, dysuria Abdominal:   No nausea, vomiting, diarrhea, bright red blood per rectum, melena, or hematemesis Neurologic:  No visual changes, wkns, changes in mental status. All other systems reviewed and are otherwise negative except as noted above.  Physical Exam/Data    Vitals:   12/30/19 0500 12/30/19 0600 12/30/19 0801 12/30/19 0946  BP: (!) 129/91 130/81 133/74 113/87  Pulse: 89 90 68 (!) 52  Resp: (!) 22 19 (!) 25   Temp:    98.1 F (36.7 C)  TempSrc:    Oral  SpO2: 97% 99% 98% 98%  Weight:      Height:        Intake/Output Summary (Last 24 hours) at 12/30/2019 1057 Last data filed at 12/30/2019 0600 Gross per 24 hour  Intake 105.93 ml  Output --  Net 105.93 ml   Filed Weights   12/29/19 1619  Weight: 127 kg   Body mass index is 44.18 kg/m.   General: Pleasant, NAD Psych: Normal affect. Neuro: Alert and oriented X 3. Moves all extremities spontaneously. HEENT: Normal  Neck: Supple without bruits or JVD. Lungs:  Resp regular and unlabored, CTA. Heart: Irregularly irregular. no s3, s4, or murmurs. Abdomen: Soft, non-tender, non-distended, BS + x 4.  Extremities: No clubbing, cyanosis or edema. DP/PT/Radials 2+ and equal bilaterally.   EKG:  The EKG was personally reviewed and demonstrates: Atrial flutter with RVR, HR 139 with 2:1 conduction.   Telemetry:  Telemetry was  personally reviewed and demonstrates: Atrial flutter with variable AV block. HR in 90's to 110's.    Labs/Studies     Relevant CV Studies:  Echocardiogram: Pending  Laboratory Data:  Chemistry Recent Labs  Lab 12/29/19 1643 12/30/19 0639  NA 137 137  K 4.5 4.3  CL 105 102  CO2 23 22  GLUCOSE 105* 113*  BUN 16 15  CREATININE 0.90 0.83  CALCIUM 9.4 9.6  GFRNONAA >60 >60  GFRAA >60 >60  ANIONGAP 9 13    Recent Labs  Lab 12/29/19 1643 12/30/19 0639  PROT 7.1  7.0  ALBUMIN 4.1 3.9  AST 36 37  ALT 37 38  ALKPHOS 51 52  BILITOT 0.9 1.1   Hematology Recent Labs  Lab 12/29/19 1643 12/30/19 0639  WBC 9.6 8.6  RBC 4.69 4.64  HGB 13.9 13.7  HCT 43.7 43.4  MCV 93.2 93.5  MCH 29.6 29.5  MCHC 31.8 31.6  RDW 13.2 13.2  PLT 271 336   Cardiac EnzymesNo results for input(s): TROPONINI in the last 168 hours. No results for input(s): TROPIPOC in the last 168 hours.  BNP Recent Labs  Lab 12/29/19 1643  BNP 34.0    DDimer  Recent Labs  Lab 12/29/19 1643  DDIMER 1.47*    Radiology/Studies:  DG Chest 2 View  Result Date: 12/29/2019 CLINICAL DATA:  64 year old male with history of tachycardia and shortness of breath. EXAM: CHEST - 2 VIEW COMPARISON:  Chest x-ray 02/18/2004. FINDINGS: Several small nodular densities are noted in the lungs, most evident in the right mid lung. No consolidative airspace disease. Trace bilateral pleural effusions. No pneumothorax. No evidence of pulmonary edema. Heart size appears borderline enlarged. Upper mediastinal contours are within normal limits. Aortic atherosclerosis. IMPRESSION: 1. Several small nodular densities in the lungs. These are nonspecific and could be of infectious or inflammatory etiology. However, the possibility of metastatic disease is not excluded. At the very least, follow-up standing PA and lateral chest radiograph is recommended in 2-3 weeks to ensure the stability or resolution of these findings. If of immediate  clinical concern, this could be further evaluated with chest CT. Electronically Signed   By: Trudie Reed M.D.   On: 12/29/2019 18:15   CT ANGIO CHEST PE W OR WO CONTRAST  Result Date: 12/30/2019 CLINICAL DATA:  Elevated D-dimer. Short of breath. EXAM: CT ANGIOGRAPHY CHEST WITH CONTRAST TECHNIQUE: Multidetector CT imaging of the chest was performed using the standard protocol during bolus administration of intravenous contrast. Multiplanar CT image reconstructions and MIPs were obtained to evaluate the vascular anatomy. CONTRAST:  OMNIPAQUE IOHEXOL 350 MG/ML SOLN COMPARISON:  None. FINDINGS: Cardiovascular: No filling defects within the pulmonary arteries arteries to suggest acute pulmonary embolism. Coronary artery calcification and aortic atherosclerotic calcification. Mediastinum/Nodes: No axillary or supraclavicular adenopathy. No mediastinal hilar adenopathy. No pericardial effusion Lungs/Pleura: No pulmonary infarction. No infiltrate. No pulmonary edema. Subpleural focus of ground-glass density and nodularity in the peripheral RIGHT upper lobe on image 62/2 measures 1 cm. This appears to be a small be a small focus of infection inflammation. Mild basilar atelectasis on the RIGHT and small effusion. Upper Abdomen: Limited view of the liver, kidneys, pancreas are unremarkable. Normal adrenal glands. Musculoskeletal: No aggressive osseous lesion. Review of the MIP images confirms the above findings. IMPRESSION: 1. No evidence acute pulmonary embolism. 2. Small focus of infection inflammation in the RIGHT upper lobe. 3. Small RIGHT effusion. 4. Coronary artery calcification and aortic atherosclerotic calcification. Electronically Signed   By: Genevive Bi M.D.   On: 12/30/2019 04:53     Assessment & Plan    1. Typical atrial flutter: Likely the cause of his shortness of breath, weakness, and fatigue. He has an echo pending. He is currently on diltiazem drip and has received Eliquis. He has  a CHA2DS2-VASc of 2 for age greater than 50 and possible hypertension. We Praneel Haisley transition him to p.o. diltiazem if his echo comes back with a normal ejection fraction. His heart rates remain fast and thus would likely start him at 180 or 240 mg daily. If his heart  rates are well controlled on the diltiazem, he would likely be able to be discharged. We Theresa Dohrman arrange for him to follow-up in EP clinic for further discussion of rhythm control with either ablation or cardioversion.    For questions or updates, please contact [facility] Please consult [email or web address] for contact info under Cardiology/STEMI.  Signed, Cherl Gorney Jorja Loa, PA-C 12/30/2019, 10:57 AM Pager: [phone]

## 2019-12-30 NOTE — Progress Notes (Signed)
ANTICOAGULATION CONSULT NOTE - Initial Consult  Pharmacy Consult for apixaban Indication: atrial fibrillation  Not on File  Patient Measurements: Height: 5' 6.75" (169.5 cm) Weight: 280 lb (127 kg) IBW/kg (Calculated) : 65.53   Vital Signs: BP: 133/74 (01/16 0801) Pulse Rate: 68 (01/16 0801)  Labs: Recent Labs    12/29/19 1643 12/29/19 2217 12/30/19 0639  HGB 13.9  --  13.7  HCT 43.7  --  43.4  PLT 271  --  336  CREATININE 0.90  --   --   TROPONINIHS 19* 26*  --     Estimated Creatinine Clearance: 104.3 mL/min (by C-G formula based on SCr of 0.9 mg/dL).   Medical History: Past Medical History:  Diagnosis Date  . Arthritis   . Eczema   . Hypertension     Medications:  (Not in a hospital admission)   Assessment: Pharmacy consulted to dose apixaban in patient with newly diagnosed afib with RVR.  Goal of Therapy:   Monitor platelets by anticoagulation protocol: Yes   Plan:  Apixaban 5 mg twice daily. Monitor labs and s/s of bleeding.  Danny Mccormick 12/30/2019,8:21 AM

## 2019-12-31 DIAGNOSIS — I5021 Acute systolic (congestive) heart failure: Secondary | ICD-10-CM

## 2019-12-31 DIAGNOSIS — I483 Typical atrial flutter: Principal | ICD-10-CM

## 2019-12-31 MED ORDER — METOPROLOL TARTRATE 50 MG PO TABS
75.0000 mg | ORAL_TABLET | Freq: Two times a day (BID) | ORAL | Status: DC
  Administered 2019-12-31 – 2020-01-01 (×2): 75 mg via ORAL
  Filled 2019-12-31 (×2): qty 1

## 2019-12-31 MED ORDER — FUROSEMIDE 10 MG/ML IJ SOLN
40.0000 mg | Freq: Once | INTRAMUSCULAR | Status: AC
  Administered 2019-12-31: 10:00:00 40 mg via INTRAVENOUS
  Filled 2019-12-31: qty 4

## 2019-12-31 MED ORDER — METOPROLOL TARTRATE 50 MG PO TABS
50.0000 mg | ORAL_TABLET | Freq: Two times a day (BID) | ORAL | Status: DC
  Administered 2019-12-31: 50 mg via ORAL
  Filled 2019-12-31: qty 1

## 2019-12-31 NOTE — Progress Notes (Signed)
Progress Note  Patient Name: Danny Mccormick Date of Encounter: 12/31/2019  Primary Cardiologist: No primary care provider on file.   Subjective   Continues to feel well with improved rate control  Inpatient Medications    Scheduled Meds: . apixaban  5 mg Oral BID  . sodium chloride flush  3 mL Intravenous Q12H   Continuous Infusions: . sodium chloride    . diltiazem (CARDIZEM) infusion 15 mg/hr (12/31/19 0653)   PRN Meds: sodium chloride, acetaminophen **OR** acetaminophen, sodium chloride flush   Vital Signs    Vitals:   12/30/19 2130 12/31/19 0116 12/31/19 0336 12/31/19 0830  BP: 105/68 108/74 (!) 115/92 128/81  Pulse: (!) 103 (!) 108 (!) 55 68  Resp: 20  17 17   Temp: 97.8 F (36.6 C) 97.8 F (36.6 C) 98.6 F (37 C) 98.2 F (36.8 C)  TempSrc: Oral Oral Oral Oral  SpO2: 93%  93% 93%  Weight:      Height:        Intake/Output Summary (Last 24 hours) at 12/31/2019 0841 Last data filed at 12/31/2019 0600 Gross per 24 hour  Intake 315 ml  Output --  Net 315 ml   Last 3 Weights 12/29/2019  Weight (lbs) 280 lb  Weight (kg) 127.007 kg      Telemetry    Atrial flutter- Personally Reviewed  ECG    None new- Personally Reviewed  Physical Exam   GEN: No acute distress.   Neck: No JVD Cardiac:  Tachycardic, irregular, no murmurs, rubs, or gallops.  Respiratory: Clear to auscultation bilaterally. GI: Soft, nontender, non-distended  MS: No edema; No deformity. Neuro:  Nonfocal  Psych: Normal affect   Labs    High Sensitivity Troponin:   Recent Labs  Lab 12/29/19 1643 12/29/19 2217  TROPONINIHS 19* 26*      Chemistry Recent Labs  Lab 12/29/19 1643 12/30/19 0639  NA 137 137  K 4.5 4.3  CL 105 102  CO2 23 22  GLUCOSE 105* 113*  BUN 16 15  CREATININE 0.90 0.83  CALCIUM 9.4 9.6  PROT 7.1 7.0  ALBUMIN 4.1 3.9  AST 36 37  ALT 37 38  ALKPHOS 51 52  BILITOT 0.9 1.1  GFRNONAA >60 >60  GFRAA >60 >60  ANIONGAP 9 13      Hematology Recent Labs  Lab 12/29/19 1643 12/30/19 0639  WBC 9.6 8.6  RBC 4.69 4.64  HGB 13.9 13.7  HCT 43.7 43.4  MCV 93.2 93.5  MCH 29.6 29.5  MCHC 31.8 31.6  RDW 13.2 13.2  PLT 271 336    BNP Recent Labs  Lab 12/29/19 1643  BNP 34.0     DDimer  Recent Labs  Lab 12/29/19 1643  DDIMER 1.47*     Radiology    DG Chest 2 View  Result Date: 12/29/2019 CLINICAL DATA:  64 year old male with history of tachycardia and shortness of breath. EXAM: CHEST - 2 VIEW COMPARISON:  Chest x-ray 02/18/2004. FINDINGS: Several small nodular densities are noted in the lungs, most evident in the right mid lung. No consolidative airspace disease. Trace bilateral pleural effusions. No pneumothorax. No evidence of pulmonary edema. Heart size appears borderline enlarged. Upper mediastinal contours are within normal limits. Aortic atherosclerosis. IMPRESSION: 1. Several small nodular densities in the lungs. These are nonspecific and could be of infectious or inflammatory etiology. However, the possibility of metastatic disease is not excluded. At the very least, follow-up standing PA and lateral chest radiograph is recommended in 2-3  weeks to ensure the stability or resolution of these findings. If of immediate clinical concern, this could be further evaluated with chest CT. Electronically Signed   By: Trudie Reed M.D.   On: 12/29/2019 18:15   CT ANGIO CHEST PE W OR WO CONTRAST  Result Date: 12/30/2019 CLINICAL DATA:  Elevated D-dimer. Short of breath. EXAM: CT ANGIOGRAPHY CHEST WITH CONTRAST TECHNIQUE: Multidetector CT imaging of the chest was performed using the standard protocol during bolus administration of intravenous contrast. Multiplanar CT image reconstructions and MIPs were obtained to evaluate the vascular anatomy. CONTRAST:  OMNIPAQUE IOHEXOL 350 MG/ML SOLN COMPARISON:  None. FINDINGS: Cardiovascular: No filling defects within the pulmonary arteries arteries to suggest acute  pulmonary embolism. Coronary artery calcification and aortic atherosclerotic calcification. Mediastinum/Nodes: No axillary or supraclavicular adenopathy. No mediastinal hilar adenopathy. No pericardial effusion Lungs/Pleura: No pulmonary infarction. No infiltrate. No pulmonary edema. Subpleural focus of ground-glass density and nodularity in the peripheral RIGHT upper lobe on image 62/2 measures 1 cm. This appears to be a small be a small focus of infection inflammation. Mild basilar atelectasis on the RIGHT and small effusion. Upper Abdomen: Limited view of the liver, kidneys, pancreas are unremarkable. Normal adrenal glands. Musculoskeletal: No aggressive osseous lesion. Review of the MIP images confirms the above findings. IMPRESSION: 1. No evidence acute pulmonary embolism. 2. Small focus of infection inflammation in the RIGHT upper lobe. 3. Small RIGHT effusion. 4. Coronary artery calcification and aortic atherosclerotic calcification. Electronically Signed   By: Genevive Bi M.D.   On: 12/30/2019 04:53   ECHOCARDIOGRAM COMPLETE  Result Date: 12/30/2019   ECHOCARDIOGRAM REPORT   Patient Name:   Danny Mccormick Date of Exam: 12/30/2019 Medical Rec #:  [id number]        Height:       66.7 in Accession #:    [id number]       Weight:       280.0 lb Date of Birth:  12/30/1955       BSA:          2.33 m Patient Age:    63 years         BP:           113/87 mmHg Patient Gender: M                HR:           99 bpm. Exam Location:  Inpatient Procedure: 2D Echo, Color Doppler and Cardiac Doppler Indications:    I48.92* Unspecified atrial flutter  History:        Patient has no prior history of Echocardiogram examinations.                 Arrythmias:Atrial Flutter; Risk Factors:Hypertension.  Sonographer:    Irving Burton Senior RDCS Referring Phys: [id number] JAMES KIM IMPRESSIONS  1. Left ventricular ejection fraction, by visual estimation, is 30 to 35%. The left ventricle has severely decreased function. There is mildly  increased left ventricular hypertrophy.  2. Left ventricular diastolic function could not be evaluated.  3. The left ventricle demonstrates global hypokinesis.  4. Global right ventricle has mildly reduced systolic function.The right ventricular size is normal. No increase in right ventricular wall thickness.  5. Left atrial size was moderately dilated.  6. Right atrial size was moderately dilated.  7. The mitral valve is abnormal. Mild mitral valve regurgitation.  8. The tricuspid valve is grossly normal.  9. The aortic valve was not well visualized. Aortic valve  regurgitation is not visualized. 10. The pulmonic valve was grossly normal. Pulmonic valve regurgitation is trivial. 11. Moderately elevated pulmonary artery systolic pressure. 12. The inferior vena cava is dilated in size with <50% respiratory variability, suggesting right atrial pressure of 15 mmHg. FINDINGS  Left Ventricle: Left ventricular ejection fraction, by visual estimation, is 30 to 35%. The left ventricle has severely decreased function. The left ventricle demonstrates global hypokinesis. There is mildly increased left ventricular hypertrophy. The left ventricular diastology could not be evaluated due to atrial fibrillation. Left ventricular diastolic function could not be evaluated. Right Ventricle: The right ventricular size is normal. No increase in right ventricular wall thickness. Global RV systolic function is has mildly reduced systolic function. The tricuspid regurgitant velocity is 2.96 m/s, and with an assumed right atrial pressure of 15 mmHg, the estimated right ventricular systolic pressure is moderately elevated at 49.9 mmHg. Left Atrium: Left atrial size was moderately dilated. Right Atrium: Right atrial size was moderately dilated Pericardium: There is no evidence of pericardial effusion. Mitral Valve: The mitral valve is abnormal. There is mild thickening of the mitral valve leaflet(s). Mild mitral valve regurgitation. Tricuspid  Valve: The tricuspid valve is grossly normal. Tricuspid valve regurgitation is mild. Aortic Valve: The aortic valve was not well visualized. Aortic valve regurgitation is not visualized. Pulmonic Valve: The pulmonic valve was grossly normal. Pulmonic valve regurgitation is trivial. Pulmonic regurgitation is trivial. Aorta: The aortic root and ascending aorta are structurally normal, with no evidence of dilitation. Venous: The inferior vena cava is dilated in size with less than 50% respiratory variability, suggesting right atrial pressure of 15 mmHg. IAS/Shunts: No atrial level shunt detected by color flow Doppler.  LEFT VENTRICLE PLAX 2D LVIDd:         4.40 cm LVIDs:         4.20 cm LV PW:         1.40 cm LV IVS:        1.00 cm LVOT diam:     2.20 cm LV SV:         9 ml LV SV Index:   3.63 LVOT Area:     3.80 cm  LV Volumes (MOD) LV area d, A2C:    36.90 cm LV area d, A4C:    46.30 cm LV area s, A2C:    29.00 cm LV area s, A4C:    34.40 cm LV major d, A2C:   9.42 cm LV major d, A4C:   9.58 cm LV major s, A2C:   8.50 cm LV major s, A4C:   8.52 cm LV vol d, MOD A2C: 123.0 ml LV vol d, MOD A4C: 183.0 ml LV vol s, MOD A2C: 85.0 ml LV vol s, MOD A4C: 117.0 ml LV SV MOD A2C:     38.0 ml LV SV MOD A4C:     183.0 ml LV SV MOD BP:      50.9 ml RIGHT VENTRICLE TAPSE (M-mode): 1.2 cm LEFT ATRIUM              Index       RIGHT ATRIUM           Index LA diam:        5.00 cm  2.15 cm/m  RA Area:     28.50 cm LA Vol (A2C):   105.0 ml 45.14 ml/m RA Volume:   101.00 ml 43.42 ml/m LA Vol (A4C):   95.2 ml  40.93 ml/m LA Biplane Vol: 103.0 ml  44.28 ml/m  AORTIC VALVE LVOT Vmax:   49.00 cm/s LVOT Vmean:  37.400 cm/s LVOT VTI:    0.079 m  AORTA Ao Root diam: 3.50 cm Ao Asc diam:  3.30 cm TRICUSPID VALVE TR Peak grad:   34.9 mmHg TR Vmax:        309.00 cm/s  SHUNTS Systemic VTI:  0.08 m Systemic Diam: 2.20 cm  Zoila Shutter MD Electronically signed by Zoila Shutter MD Signature Date/Time: 12/30/2019/2:58:35 PM    Final      Cardiac Studies   TTE 12/29/18  1. Left ventricular ejection fraction, by visual estimation, is 30 to 35%. The left ventricle has severely decreased function. There is mildly increased left ventricular hypertrophy.  2. Left ventricular diastolic function could not be evaluated.  3. The left ventricle demonstrates global hypokinesis.  4. Global right ventricle has mildly reduced systolic function.The right ventricular size is normal. No increase in right ventricular wall thickness.  5. Left atrial size was moderately dilated.  6. Right atrial size was moderately dilated.  7. The mitral valve is abnormal. Mild mitral valve regurgitation.  8. The tricuspid valve is grossly normal.  9. The aortic valve was not well visualized. Aortic valve regurgitation is not visualized. 10. The pulmonic valve was grossly normal. Pulmonic valve regurgitation is trivial. 11. Moderately elevated pulmonary artery systolic pressure. 12. The inferior vena cava is dilated in size with <50% respiratory variability, suggesting right atrial pressure of 15 mmHg.  Patient Profile     64 y.o. male with a history of hypertension presented to the hospital with shortness of breath, found to be in atrial flutter  Assessment & Plan    1.  Typical appearing atrial flutter: Patient remains in atrial flutter with heart rates around 100.  He is currently on Eliquis and a diltiazem drip..  Today, I Kaylah Chiasson see if we can convert him to oral medications.  We Madalina Rosman start him on metoprolol 50 mg twice a day to see if we can get his rate well controlled.  If we can, he may be able to be discharged from the hospital with follow-up in clinic for discussion of ablation.  If his heart rates cannot be well controlled, Stanislaus Kaltenbach likely need TEE and cardioversion.  2.  Acute systolic heart failure: Possibly in the setting of atrial flutter and a tachycardia induced cardiomyopathy.  We Carsen Leaf switch him to metoprolol in the hope of getting him off of  the IV.  We Abenezer Odonell also start with IV diuresis with Lasix as his IVC is dilated and his right atrial pressure is elevated.  This may help to control his atrial flutter rates..      For questions or updates, please contact [facility] Please consult [email or web address] for contact info under        Signed, Braxton Vantrease Jorja Loa, MD  12/31/2019, 8:41 AM

## 2019-12-31 NOTE — Plan of Care (Signed)
  Problem: Health Behavior/Discharge Planning: Goal: Ability to manage health-related needs will improve Outcome: Progressing   Problem: Clinical Measurements: Goal: Ability to maintain clinical measurements within normal limits will improve Outcome: Progressing   

## 2019-12-31 NOTE — Progress Notes (Signed)
Patient ID: Danny Mccormick, male   DOB: [birth year], 64 y.o.   MRN: [id number]  PROGRESS NOTE    MADOC HOLQUIN  DXA:[id number] DOB: 11-Nov-1956 DOA: 12/29/2019 PCP: Carylon Perches, MD   Brief Narrative:  CT 55-year-old male with history of hypertension presented with worsening shortness of breath.  On presentation, he was found to be in atrial flutter with rapid ventricular rate with heart rate of 139.  COVID-19 test was negative.  He was started on Cardizem drip and transferred to [facility].  Cardiology was consulted.  Assessment & Plan:   New diagnosis of paroxysmal atrial flutter with rapid ventricular rate -Currently on Cardizem drip.  Still tachycardic.  Cardiology following. -CHADSVASc= 2 (HTN, Age).  Continue Eliquis  New diagnosis of cardiomyopathy/acute systolic heart failure -EF of 30 to 35%.  Cardiology following.  Might need to start Lasix as well. -Strict input and output.  Daily weights.  Fluid restriction  Essential hypertension -Not on any meds as an outpatient.  Monitor blood pressure.  Currently on Cardizem drip  Morbid obesity -Outpatient follow-up.  Lifestyle modification   DVT prophylaxis: Eliquis Code Status: Full Family Communication: Patient at bedside Disposition Plan: Home in 1 to 3 days once cleared by cardiology  Consultants: Cardiology  Procedures: Echo  Antimicrobials: None   Subjective: Patient seen and examined at bedside.  Feels intermittently short of breath.  No overnight fever or vomiting.  Objective: Vitals:   12/30/19 2014 12/30/19 2130 12/31/19 0116 12/31/19 0336  BP: (!) 121/103 105/68 108/74 (!) 115/92  Pulse: 70 (!) 103 (!) 108 (!) 55  Resp: 18 20  17   Temp: 98 F (36.7 C) 97.8 F (36.6 C) 97.8 F (36.6 C) 98.6 F (37 C)  TempSrc: Oral Oral Oral Oral  SpO2: 93% 93%  93%  Weight:      Height:        Intake/Output Summary (Last 24 hours) at 12/31/2019 0804 Last data filed at 12/31/2019 0600 Gross per 24 hour    Intake 315 ml  Output --  Net 315 ml   Filed Weights   12/29/19 1619  Weight: 127 kg    Examination:  General exam: Appears calm and comfortable.  No distress Respiratory system: Bilateral decreased breath sounds at bases with basilar crackles Cardiovascular system: S1 & S2 heard, tachycardic Gastrointestinal system: Abdomen is morbidly obese, nondistended, soft and nontender. Normal bowel sounds heard. Extremities: No cyanosis, clubbing; trace lower extremity edema Central nervous system: Alert and oriented. No focal neurological deficits. Moving extremities Skin: No rashes, lesions or ulcers Psychiatry: Flat affect    Data Reviewed: I have personally reviewed following labs and imaging studies  CBC: Recent Labs  Lab 12/29/19 1643 12/30/19 0639  WBC 9.6 8.6  HGB 13.9 13.7  HCT 43.7 43.4  MCV 93.2 93.5  PLT 271 336   Basic Metabolic Panel: Recent Labs  Lab 12/29/19 1643 12/30/19 0639  NA 137 137  K 4.5 4.3  CL 105 102  CO2 23 22  GLUCOSE 105* 113*  BUN 16 15  CREATININE 0.90 0.83  CALCIUM 9.4 9.6   GFR: Estimated Creatinine Clearance: 113.1 mL/min (by C-G formula based on SCr of 0.83 mg/dL). Liver Function Tests: Recent Labs  Lab 12/29/19 1643 12/30/19 0639  AST 36 37  ALT 37 38  ALKPHOS 51 52  BILITOT 0.9 1.1  PROT 7.1 7.0  ALBUMIN 4.1 3.9   No results for input(s): LIPASE, AMYLASE in the last 168 hours. No results for  input(s): AMMONIA in the last 168 hours. Coagulation Profile: No results for input(s): INR, PROTIME in the last 168 hours. Cardiac Enzymes: No results for input(s): CKTOTAL, CKMB, CKMBINDEX, TROPONINI in the last 168 hours. BNP (last 3 results) No results for input(s): PROBNP in the last 8760 hours. HbA1C: No results for input(s): HGBA1C in the last 72 hours. CBG: No results for input(s): GLUCAP in the last 168 hours. Lipid Profile: No results for input(s): CHOL, HDL, LDLCALC, TRIG, CHOLHDL, LDLDIRECT in the last 72  hours. Thyroid Function Tests: Recent Labs    12/30/19 0639  TSH 0.790   Anemia Panel: No results for input(s): VITAMINB12, FOLATE, FERRITIN, TIBC, IRON, RETICCTPCT in the last 72 hours. Sepsis Labs: No results for input(s): PROCALCITON, LATICACIDVEN in the last 168 hours.  Recent Results (from the past 240 hour(s))  Novel Coronavirus, NAA ([facility])     Status: None   Collection Time: 12/26/19 10:51 AM   Specimen: Nasopharyngeal(NP) swabs in vial transport medium   NASOPHARYNGE  TESTING  Result Value Ref Range Status   SARS-CoV-2, NAA Not Detected Not Detected Final    Comment: This nucleic acid amplification test was developed and its performance characteristics determined by [employer]. Nucleic acid amplification tests include PCR and TMA. This test has not been FDA cleared or approved. This test has been authorized by FDA under an Emergency Use Authorization (EUA). This test is only authorized for the duration of time the declaration that circumstances exist justifying the authorization of the emergency use of in vitro diagnostic tests for detection of SARS-CoV-2 virus and/or diagnosis of COVID-19 infection under section 564(b)(1) of the Act, 21 U.S.C. 209OBS-9(G) (1), unless the authorization is terminated or revoked sooner. When diagnostic testing is negative, the possibility of a false negative result should be considered in the context of a patient's recent exposures and the presence of clinical signs and symptoms consistent with COVID-19. An individual without symptoms of COVID-19 and who is not shedding SARS-CoV-2 virus would  expect to have a negative (not detected) result in this assay.   Respiratory Panel by RT PCR (Flu A&B, Covid) - Nasopharyngeal Swab     Status: None   Collection Time: 12/29/19  9:32 PM   Specimen: Nasopharyngeal Swab  Result Value Ref Range Status   SARS Coronavirus 2 by RT PCR NEGATIVE NEGATIVE Final    Comment: (NOTE) SARS-CoV-2  target nucleic acids are NOT DETECTED. The SARS-CoV-2 RNA is generally detectable in upper respiratoy specimens during the acute phase of infection. The lowest concentration of SARS-CoV-2 viral copies this assay can detect is 131 copies/mL. A negative result does not preclude SARS-Cov-2 infection and should not be used as the sole basis for treatment or other patient management decisions. A negative result may occur with  improper specimen collection/handling, submission of specimen other than nasopharyngeal swab, presence of viral mutation(s) within the areas targeted by this assay, and inadequate number of viral copies (<131 copies/mL). A negative result must be combined with clinical observations, patient history, and epidemiological information. The expected result is Negative. Fact Sheet for Patients:  https://www.moore.com/ Fact Sheet for Healthcare Providers:  https://www.young.biz/ This test is not yet ap proved or cleared by the [place] FDA and  has been authorized for detection and/or diagnosis of SARS-CoV-2 by FDA under an Emergency Use Authorization (EUA). This EUA will remain  in effect (meaning this test can be used) for the duration of the COVID-19 declaration under Section 564(b)(1) of the Act, 21 U.S.C. section 360bbb-3(b)(1),  unless the authorization is terminated or revoked sooner.    Influenza A by PCR NEGATIVE NEGATIVE Final   Influenza B by PCR NEGATIVE NEGATIVE Final    Comment: (NOTE) The Xpert Xpress SARS-CoV-2/FLU/RSV assay is intended as an aid in  the diagnosis of influenza from Nasopharyngeal swab specimens and  should not be used as a sole basis for treatment. Nasal washings and  aspirates are unacceptable for Xpert Xpress SARS-CoV-2/FLU/RSV  testing. Fact Sheet for Patients: https://www.moore.com/ Fact Sheet for Healthcare Providers: https://www.young.biz/ This test is  not yet approved or cleared by the [place] FDA and  has been authorized for detection and/or diagnosis of SARS-CoV-2 by  FDA under an Emergency Use Authorization (EUA). This EUA will remain  in effect (meaning this test can be used) for the duration of the  Covid-19 declaration under Section 564(b)(1) of the Act, 21  U.S.C. section 360bbb-3(b)(1), unless the authorization is  terminated or revoked. Performed at [facility], [address], [place], [place] [address]          Radiology Studies: DG Chest 2 View  Result Date: 12/29/2019 CLINICAL DATA:  64 year old male with history of tachycardia and shortness of breath. EXAM: CHEST - 2 VIEW COMPARISON:  Chest x-ray 02/18/2004. FINDINGS: Several small nodular densities are noted in the lungs, most evident in the right mid lung. No consolidative airspace disease. Trace bilateral pleural effusions. No pneumothorax. No evidence of pulmonary edema. Heart size appears borderline enlarged. Upper mediastinal contours are within normal limits. Aortic atherosclerosis. IMPRESSION: 1. Several small nodular densities in the lungs. These are nonspecific and could be of infectious or inflammatory etiology. However, the possibility of metastatic disease is not excluded. At the very least, follow-up standing PA and lateral chest radiograph is recommended in 2-3 weeks to ensure the stability or resolution of these findings. If of immediate clinical concern, this could be further evaluated with chest CT. Electronically Signed   By: Trudie Reed M.D.   On: 12/29/2019 18:15   CT ANGIO CHEST PE W OR WO CONTRAST  Result Date: 12/30/2019 CLINICAL DATA:  Elevated D-dimer. Short of breath. EXAM: CT ANGIOGRAPHY CHEST WITH CONTRAST TECHNIQUE: Multidetector CT imaging of the chest was performed using the standard protocol during bolus administration of intravenous contrast. Multiplanar CT image reconstructions and MIPs were obtained to evaluate the vascular  anatomy. CONTRAST:  OMNIPAQUE IOHEXOL 350 MG/ML SOLN COMPARISON:  None. FINDINGS: Cardiovascular: No filling defects within the pulmonary arteries arteries to suggest acute pulmonary embolism. Coronary artery calcification and aortic atherosclerotic calcification. Mediastinum/Nodes: No axillary or supraclavicular adenopathy. No mediastinal hilar adenopathy. No pericardial effusion Lungs/Pleura: No pulmonary infarction. No infiltrate. No pulmonary edema. Subpleural focus of ground-glass density and nodularity in the peripheral RIGHT upper lobe on image 62/2 measures 1 cm. This appears to be a small be a small focus of infection inflammation. Mild basilar atelectasis on the RIGHT and small effusion. Upper Abdomen: Limited view of the liver, kidneys, pancreas are unremarkable. Normal adrenal glands. Musculoskeletal: No aggressive osseous lesion. Review of the MIP images confirms the above findings. IMPRESSION: 1. No evidence acute pulmonary embolism. 2. Small focus of infection inflammation in the RIGHT upper lobe. 3. Small RIGHT effusion. 4. Coronary artery calcification and aortic atherosclerotic calcification. Electronically Signed   By: Genevive Bi M.D.   On: 12/30/2019 04:53   ECHOCARDIOGRAM COMPLETE  Result Date: 12/30/2019   ECHOCARDIOGRAM REPORT   Patient Name:   COLEY KULIKOWSKI Date of Exam: 12/30/2019 Medical Rec #:  [id number]        Height:       66.7 in Accession #:    [id number]       Weight:       280.0 lb Date of Birth:  1956-05-29       BSA:          2.33 m Patient Age:    56 years         BP:           113/87 mmHg Patient Gender: M                HR:           99 bpm. Exam Location:  Inpatient Procedure: 2D Echo, Color Doppler and Cardiac Doppler Indications:    I48.92* Unspecified atrial flutter  History:        Patient has no prior history of Echocardiogram examinations.                 Arrythmias:Atrial Flutter; Risk Factors:Hypertension.  Sonographer:    Raquel Sarna Senior RDCS Referring  Phys: Hasty  1. Left ventricular ejection fraction, by visual estimation, is 30 to 35%. The left ventricle has severely decreased function. There is mildly increased left ventricular hypertrophy.  2. Left ventricular diastolic function could not be evaluated.  3. The left ventricle demonstrates global hypokinesis.  4. Global right ventricle has mildly reduced systolic function.The right ventricular size is normal. No increase in right ventricular wall thickness.  5. Left atrial size was moderately dilated.  6. Right atrial size was moderately dilated.  7. The mitral valve is abnormal. Mild mitral valve regurgitation.  8. The tricuspid valve is grossly normal.  9. The aortic valve was not well visualized. Aortic valve regurgitation is not visualized. 10. The pulmonic valve was grossly normal. Pulmonic valve regurgitation is trivial. 11. Moderately elevated pulmonary artery systolic pressure. 12. The inferior vena cava is dilated in size with <50% respiratory variability, suggesting right atrial pressure of 15 mmHg. FINDINGS  Left Ventricle: Left ventricular ejection fraction, by visual estimation, is 30 to 35%. The left ventricle has severely decreased function. The left ventricle demonstrates global hypokinesis. There is mildly increased left ventricular hypertrophy. The left ventricular diastology could not be evaluated due to atrial fibrillation. Left ventricular diastolic function could not be evaluated. Right Ventricle: The right ventricular size is normal. No increase in right ventricular wall thickness. Global RV systolic function is has mildly reduced systolic function. The tricuspid regurgitant velocity is 2.96 m/s, and with an assumed right atrial pressure of 15 mmHg, the estimated right ventricular systolic pressure is moderately elevated at 49.9 mmHg. Left Atrium: Left atrial size was moderately dilated. Right Atrium: Right atrial size was moderately dilated Pericardium: There is no  evidence of pericardial effusion. Mitral Valve: The mitral valve is abnormal. There is mild thickening of the mitral valve leaflet(s). Mild mitral valve regurgitation. Tricuspid Valve: The tricuspid valve is grossly normal. Tricuspid valve regurgitation is mild. Aortic Valve: The aortic valve was not well visualized. Aortic valve regurgitation is not visualized. Pulmonic Valve: The pulmonic valve was grossly normal. Pulmonic valve regurgitation is trivial. Pulmonic regurgitation is trivial. Aorta: The aortic root and ascending aorta are structurally normal, with no evidence of dilitation. Venous: The inferior vena cava is dilated in size with less than 50% respiratory variability, suggesting right atrial pressure of 15 mmHg. IAS/Shunts: No atrial level shunt detected by color flow Doppler.  LEFT VENTRICLE PLAX 2D LVIDd:  4.40 cm LVIDs:         4.20 cm LV PW:         1.40 cm LV IVS:        1.00 cm LVOT diam:     2.20 cm LV SV:         9 ml LV SV Index:   3.63 LVOT Area:     3.80 cm  LV Volumes (MOD) LV area d, A2C:    36.90 cm LV area d, A4C:    46.30 cm LV area s, A2C:    29.00 cm LV area s, A4C:    34.40 cm LV major d, A2C:   9.42 cm LV major d, A4C:   9.58 cm LV major s, A2C:   8.50 cm LV major s, A4C:   8.52 cm LV vol d, MOD A2C: 123.0 ml LV vol d, MOD A4C: 183.0 ml LV vol s, MOD A2C: 85.0 ml LV vol s, MOD A4C: 117.0 ml LV SV MOD A2C:     38.0 ml LV SV MOD A4C:     183.0 ml LV SV MOD BP:      50.9 ml RIGHT VENTRICLE TAPSE (M-mode): 1.2 cm LEFT ATRIUM              Index       RIGHT ATRIUM           Index LA diam:        5.00 cm  2.15 cm/m  RA Area:     28.50 cm LA Vol (A2C):   105.0 ml 45.14 ml/m RA Volume:   101.00 ml 43.42 ml/m LA Vol (A4C):   95.2 ml  40.93 ml/m LA Biplane Vol: 103.0 ml 44.28 ml/m  AORTIC VALVE LVOT Vmax:   49.00 cm/s LVOT Vmean:  37.400 cm/s LVOT VTI:    0.079 m  AORTA Ao Root diam: 3.50 cm Ao Asc diam:  3.30 cm TRICUSPID VALVE TR Peak grad:   34.9 mmHg TR Vmax:        309.00  cm/s  SHUNTS Systemic VTI:  0.08 m Systemic Diam: 2.20 cm  Zoila ShutterKenneth Hilty MD Electronically signed by Zoila ShutterKenneth Hilty MD Signature Date/Time: 12/30/2019/2:58:35 PM    Final         Scheduled Meds: . apixaban  5 mg Oral BID  . sodium chloride flush  3 mL Intravenous Q12H   Continuous Infusions: . sodium chloride    . diltiazem (CARDIZEM) infusion 15 mg/hr (12/31/19 [id number])          Glade LloydKshitiz Larin Depaoli, MD [facility] 12/31/2019, 8:04 AM

## 2019-12-31 NOTE — Progress Notes (Signed)
Patient given PO BB this AM with positive result; HR into the 60's.  Weaned off diltiazem drip over two hour period post PO administration.  Subsequently,  heart rate gradually increased back into the 120's and I gave the increased BB dose at 1800 with good effect, but only reduced HR into the 90's at this time.

## 2020-01-01 ENCOUNTER — Inpatient Hospital Stay (HOSPITAL_COMMUNITY): Payer: Medicare Other

## 2020-01-01 ENCOUNTER — Encounter (HOSPITAL_COMMUNITY)
Admission: EM | Disposition: A | Discharge status: Home / Self Care | Payer: Self-pay | Source: Ambulatory Visit | Attending: Internal Medicine

## 2020-01-01 ENCOUNTER — Inpatient Hospital Stay (HOSPITAL_COMMUNITY): Payer: Medicare Other | Admitting: Anesthesiology

## 2020-01-01 ENCOUNTER — Encounter (HOSPITAL_COMMUNITY): Payer: Self-pay | Admitting: Internal Medicine

## 2020-01-01 DIAGNOSIS — I4891 Unspecified atrial fibrillation: Secondary | ICD-10-CM

## 2020-01-01 DIAGNOSIS — I4892 Unspecified atrial flutter: Secondary | ICD-10-CM

## 2020-01-01 DIAGNOSIS — I340 Nonrheumatic mitral (valve) insufficiency: Secondary | ICD-10-CM

## 2020-01-01 DIAGNOSIS — Q211 Atrial septal defect: Secondary | ICD-10-CM

## 2020-01-01 HISTORY — PX: TEE WITHOUT CARDIOVERSION: SHX5443

## 2020-01-01 HISTORY — PX: CARDIOVERSION: SHX1299

## 2020-01-01 HISTORY — DX: Unspecified atrial fibrillation: I48.91

## 2020-01-01 HISTORY — PX: BUBBLE STUDY: SHX6837

## 2020-01-01 LAB — BASIC METABOLIC PANEL
Anion gap: 12 (ref 5–15)
BUN: 25 mg/dL — ABNORMAL HIGH (ref 8–23)
CO2: 23 mmol/L (ref 22–32)
Calcium: 9 mg/dL (ref 8.9–10.3)
Chloride: 103 mmol/L (ref 98–111)
Creatinine, Ser: 1.08 mg/dL (ref 0.61–1.24)
GFR calc Af Amer: >60 mL/min (ref >60)
GFR calc non Af Amer: >60 mL/min (ref >60)
Glucose, Bld: 102 mg/dL — ABNORMAL HIGH (ref 70–99)
Potassium: 4.4 mmol/L (ref 3.5–5.1)
Sodium: 138 mmol/L (ref 135–145)

## 2020-01-01 LAB — MAGNESIUM: Magnesium: 2 mg/dL (ref 1.7–2.4)

## 2020-01-01 SURGERY — CARDIOVERSION
Anesthesia: Monitor Anesthesia Care

## 2020-01-01 MED ORDER — LACTATED RINGERS IV SOLN: INTRAVENOUS | Status: DC

## 2020-01-01 MED ORDER — PROPOFOL 500 MG/50ML IV EMUL
INTRAVENOUS | Status: DC | PRN
  Administered 2020-01-01: 100 ug/kg/min via INTRAVENOUS
  Administered 2020-01-01: 125 ug/kg/min via INTRAVENOUS

## 2020-01-01 MED ORDER — FUROSEMIDE 40 MG PO TABS: 40.0000 mg | ORAL_TABLET | Freq: Every day | ORAL | 0 refills | Status: DC

## 2020-01-01 MED ORDER — BUTAMBEN-TETRACAINE-BENZOCAINE 2-2-14 % EX AERO
INHALATION_SPRAY | CUTANEOUS | Status: DC | PRN
  Administered 2020-01-01: 2 via TOPICAL

## 2020-01-01 MED ORDER — DEXMEDETOMIDINE HCL 200 MCG/2ML IV SOLN
INTRAVENOUS | Status: DC | PRN
  Administered 2020-01-01: 16 ug via INTRAVENOUS
  Administered 2020-01-01 (×2): 4 ug via INTRAVENOUS

## 2020-01-01 MED ORDER — METOPROLOL TARTRATE 75 MG PO TABS: 75.0000 mg | ORAL_TABLET | Freq: Two times a day (BID) | ORAL | 0 refills | Status: DC

## 2020-01-01 MED ORDER — APIXABAN 5 MG PO TABS: 5.0000 mg | ORAL_TABLET | Freq: Two times a day (BID) | ORAL | 0 refills | Status: DC

## 2020-01-01 MED ORDER — SODIUM CHLORIDE 0.9 % IV SOLN: INTRAVENOUS | Status: DC

## 2020-01-01 NOTE — CV Procedure (Signed)
INDICATIONS: atrial flutter, pre cardioversion  PROCEDURE:   Informed consent was obtained prior to the procedure. The risks, benefits and alternatives for the procedure were discussed and the patient comprehended these risks.  Risks include, but are not limited to, cough, sore throat, vomiting, nausea, somnolence, esophageal and stomach trauma or perforation, bleeding, low blood pressure, aspiration, pneumonia, infection, trauma to the teeth and death.    After a procedural time-out, the oropharynx was anesthetized with 20% benzocaine spray.   During this procedure the patient was administered propofol and lidocaine per anesthesia achieve and maintain moderate conscious sedation.  The patient's heart rate, blood pressure, and oxygen saturationweare monitored continuously during the procedure. The period of conscious sedation was 30 minutes, of which I was present face-to-face 100% of this time.  The transesophageal probe was inserted in the esophagus and stomach without difficulty and multiple views were obtained.  The patient was kept under observation until the patient left the procedure room.  The patient left the procedure room in stable condition.   Agitated microbubble saline contrast was administered.  COMPLICATIONS:    There were no immediate complications.  FINDINGS:  EF 25-30% Mild MR No LA or LAA thrombus No LV apical thrombus seen PFO with right to left shunt with respiration, + R to L shunt with agitated saline.  RECOMMENDATIONS:    Can proceed to DCCV   DCCV Procedure: Electrical Cardioversion Indications:  Atrial Flutter  Procedure Details:  Consent: Risks of procedure as well as the alternatives and risks of each were explained to the (patient/caregiver).  Consent for procedure obtained.  Time Out: Verified patient identification, verified procedure, site/side was marked, verified correct patient position, special equipment/implants available,  medications/allergies/relevent history reviewed, required imaging and test results available. PERFORMED.  Patient placed on cardiac monitor, pulse oximetry, supplemental oxygen as necessary.  Sedation given: propofol per anesthesia Pacer pads placed anterior and posterior chest.  Cardioverted 1 time(s).  Cardioversion with synchronized biphasic 120J shock.  Evaluation: Findings: Post procedure EKG shows: NSR Complications: None Patient did tolerate procedure well.  Time Spent Directly with the Patient:  60 minutes   Danny Mccormick 01/01/2020, 11:48 AM

## 2020-01-01 NOTE — Progress Notes (Signed)
Called Rhonda Barrett PA concerning pt's HR 99-104 while resting since he was being discharged. EKG done and showed SR, Rhonda made aware. Rhonda stated Dr. Royann Shivers did not want to increase metoprolol at this time. Dr. Royann Shivers spoke with pt about getting mobile monitor for his phone to keep track of HR and rhythm. Cont to monitor. Emelda Brothers RN

## 2020-01-01 NOTE — TOC Benefit Eligibility Note (Signed)
Transition of Care [facility]) Benefit Eligibility Note    Patient Details  Name: Danny Mccormick MRN: [id number] Date of Birth: 1956/07/03   Medication/Dose: Arne Cleveland) tablet 5 mg  :  Dose 5 mg  :  Oral  :  2 times daily  Covered?: Yes  Tier: 3 Drug     Spoke with Person/Company/Phone Number:: [employer]/ [phone]  Co-Pay: 402.00 for a 30 day supply  Prior Approval: No  Deductible: Unmet(365.00 left)  Additional Notes: Apixaban not on the formulary / Xarelto and Pradaxa cost even morefor a 30 day suppl/ Once deductible met  copay will be 37.00 for 30 day supply    [employer] Phone Number: 01/01/2020, 11:13 AM

## 2020-01-01 NOTE — Progress Notes (Signed)
Pt HR=120-128. Pt is sleeping. Upon awakening pt, he denies CP, palpitation, and SOB. Skin is warm and dry. Dr. Electa Sniff was made aware and said continue to monitor.

## 2020-01-01 NOTE — Progress Notes (Signed)
Pt A&OX3, breathing equal and unlabored. Pt is sitting in chair HR=132 constantly. BP=144/91. Dr. Electa Sniff was made aware and said to give 10am lopressor now.

## 2020-01-01 NOTE — Transfer of Care (Signed)
Immediate Anesthesia Transfer of Care Note  Patient: Danny Mccormick  Procedure(s) Performed: CARDIOVERSION (N/A ) TRANSESOPHAGEAL ECHOCARDIOGRAM (TEE) (N/A ) BUBBLE STUDY  Patient Location: Endoscopy Unit  Anesthesia Type:MAC and General  Level of Consciousness: awake, alert  and oriented  Airway & Oxygen Therapy: Patient Spontanous Breathing and Patient connected to nasal cannula oxygen  Post-op Assessment: Report given to RN and Post -op Vital signs reviewed and stable  Post vital signs: Reviewed and stable  Last Vitals:  Vitals Value Taken Time  BP 97/70 01/01/20 1153  Temp    Pulse 88 01/01/20 1153  Resp 16 01/01/20 1153  SpO2 97 % 01/01/20 1153  Vitals shown include unvalidated device data.  Last Pain:  Vitals:   01/01/20 1153  TempSrc:   PainSc: 4          Complications: No apparent anesthesia complications

## 2020-01-01 NOTE — Interval H&P Note (Signed)
History and Physical Interval Note:  01/01/2020 10:58 AM  Danny Mccormick  has presented today for surgery, with the diagnosis of a.flutter.  The various methods of treatment have been discussed with the patient and family. After consideration of risks, benefits and other options for treatment, the patient has consented to  Procedure(s): CARDIOVERSION (N/A) TRANSESOPHAGEAL ECHOCARDIOGRAM (TEE) (N/A) as a surgical intervention.  The patient's history has been reviewed, patient examined, no change in status, stable for surgery.  I have reviewed the patient's chart and labs.  Questions were answered to the patient's satisfaction.     Parke Poisson

## 2020-01-01 NOTE — Anesthesia Postprocedure Evaluation (Signed)
Anesthesia Post Note  Patient: Danny Mccormick  Procedure(s) Performed: CARDIOVERSION (N/A ) TRANSESOPHAGEAL ECHOCARDIOGRAM (TEE) (N/A ) BUBBLE STUDY     Patient location during evaluation: PACU Anesthesia Type: General Level of consciousness: awake and alert Pain management: pain level controlled Vital Signs Assessment: post-procedure vital signs reviewed and stable Respiratory status: spontaneous breathing, nonlabored ventilation and respiratory function stable Cardiovascular status: blood pressure returned to baseline and stable Postop Assessment: no apparent nausea or vomiting Anesthetic complications: no    Last Vitals:  Vitals:   01/01/20 1202 01/01/20 1212  BP: 118/74 121/82  Pulse: 89 91  Resp: 18 19  Temp:    SpO2: 94% 94%    Last Pain:  Vitals:   01/01/20 1212  TempSrc:   PainSc: 3                  Beryle Lathe

## 2020-01-01 NOTE — Anesthesia Procedure Notes (Signed)
Procedure Name: MAC Performed by: Milayna Rotenberg B, CRNA Pre-anesthesia Checklist: Patient identified, Emergency Drugs available, Patient being monitored, Suction available and Timeout performed Patient Re-evaluated:Patient Re-evaluated prior to induction Oxygen Delivery Method: Nasal cannula Preoxygenation: Pre-oxygenation with 100% oxygen Induction Type: IV induction Airway Equipment and Method: Bite block Placement Confirmation: positive ETCO2 Dental Injury: Teeth and Oropharynx as per pre-operative assessment        

## 2020-01-01 NOTE — Anesthesia Preprocedure Evaluation (Addendum)
Anesthesia Evaluation  Patient identified by MRN, date of birth, ID band Patient awake    Reviewed: Allergy & Precautions, NPO status , Patient's Chart, lab work & pertinent test results  History of Anesthesia Complications Negative for: history of anesthetic complications  Airway Mallampati: II  TM Distance: >3 FB Neck ROM: Full    Dental  (+) Dental Advisory Given, Chipped   Pulmonary former smoker,    Pulmonary exam normal        Cardiovascular hypertension, + dysrhythmias Atrial Fibrillation  Rhythm:Irregular Rate:Tachycardia     Neuro/Psych negative neurological ROS  negative psych ROS   GI/Hepatic negative GI ROS, Neg liver ROS,   Endo/Other  Morbid obesity  Renal/GU negative Renal ROS     Musculoskeletal  (+) Arthritis ,   Abdominal (+) + obese,   Peds  Hematology negative hematology ROS (+)   Anesthesia Other Findings   Reproductive/Obstetrics                            Anesthesia Physical Anesthesia Plan  ASA: III  Anesthesia Plan: MAC and General   Post-op Pain Management:    Induction: Intravenous  PONV Risk Score and Plan: 2 and Propofol infusion and Treatment may vary due to age or medical condition  Airway Management Planned: Nasal Cannula and Natural Airway  Additional Equipment: None  Intra-op Plan:   Post-operative Plan:   Informed Consent: I have reviewed the patients History and Physical, chart, labs and discussed the procedure including the risks, benefits and alternatives for the proposed anesthesia with the patient or authorized representative who has indicated his/her understanding and acceptance.       Plan Discussed with: CRNA and Anesthesiologist  Anesthesia Plan Comments: (May begin as MAC for TEE and convert to GA as needed for cardioversion )       Anesthesia Quick Evaluation

## 2020-01-01 NOTE — Discharge Summary (Addendum)
Physician Discharge Summary  Nicki ReaperWilliam D Szalkowski UJW:[id number]:[id number] DOB: Dec 02, 1956 DOA: 12/29/2019  PCP: Carylon PerchesFagan, Roy, MD  Admit date: 12/29/2019 Discharge date: 01/01/2020  Admitted From: Home Disposition: Home  Recommendations for Outpatient Follow-up:  1. Follow up with PCP in 1 week with repeat CBC/BMP 2. Outpatient follow-up with cardiology 3. Follow up in ED if symptoms worsen or new appear   Home Health: No Equipment/Devices: None  Discharge Condition: Stable CODE STATUS: Full Diet recommendation: Heart healthy  Brief/Interim Summary: 64 year old male with history of hypertension presented with worsening shortness of breath.  On presentation, he was found to be in atrial flutter with rapid ventricular rate with heart rate of 139.  COVID-19 test was negative.  He was started on Cardizem drip and [facility].  Cardiology was consulted. He underwent DCCV today on January 01, 2020. Cardiology has cleared the patient for discharge. He will be discharged on Eliquis and metoprolol.  Discharge Diagnoses:  New diagnosis of paroxysmal atrial flutter with rapid ventricular rate -Treated with Cardizem drip.  Oral metoprolol has also been started.  -CHADSVASc= 2 (HTN, Age).  Continue Eliquis which was started during the hospitalization -Underwent DCCV today on January 01, 2020. Cardiology has cleared the patient for discharge. Discharge patient on oral Eliquis and metoprolol. Outpatient follow-up with cardiology  New diagnosis of cardiomyopathy/acute systolic heart failure -EF of 30 to 35%.  Cardiology following.  Received 1 dose of IV Lasix yesterday.   Discharged home on Lasix 40 mg daily. -Continue low-sodium diet and fluid restriction at home.  Essential hypertension -Not on any meds as an outpatient.   Continue metoprolol on discharge  Morbid obesity -Outpatient follow-up.  Lifestyle modification  Discharge Instructions  Discharge Instructions    Ambulatory  referral to Cardiology   Complete by: As directed    Diet - low sodium heart healthy   Complete by: As directed    Increase activity slowly   Complete by: As directed      Allergies as of 01/01/2020   No Known Allergies     Medication List    TAKE these medications   apixaban 5 MG Tabs tablet Commonly known as: ELIQUIS Take 1 tablet (5 mg total) by mouth 2 (two) times daily.   furosemide 40 MG tablet Commonly known as: Lasix Take 1 tablet (40 mg total) by mouth daily.   Metoprolol Tartrate 75 MG Tabs Take 75 mg by mouth 2 (two) times daily.   Otezla 30 MG Tabs Generic drug: Apremilast Take 1 tablet by mouth 2 (two) times daily.       Follow-up Information    Carylon PerchesFagan, Roy, MD. Schedule an appointment as soon as possible for a visit in 1 week(s).   Specialty: Internal Medicine Why: with repeat cbc/bmp Contact information: [address] [place] [address] [phone]        Regan Lemmingamnitz, Will Martin, MD .   Specialty: Cardiology Contact information: [address] [place] [address] [phone]        Thurmon Fairroitoru, Mihai, MD .   Specialty: Cardiology Contact information: [address] [place] [address] [phone]          No Known Allergies  Consultations:  Cardiology   Procedures/Studies: DG Chest 2 View  Result Date: 12/29/2019 CLINICAL DATA:  64 year old male with history of tachycardia and shortness of breath. EXAM: CHEST - 2 VIEW COMPARISON:  Chest x-ray 02/18/2004. FINDINGS: Several small nodular densities are noted in the lungs, most  evident in the right mid lung. No consolidative airspace disease. Trace bilateral pleural effusions. No pneumothorax. No evidence of pulmonary edema. Heart size appears borderline enlarged. Upper mediastinal contours are within normal limits. Aortic atherosclerosis. IMPRESSION: 1. Several small nodular densities in the lungs. These are nonspecific and could be of infectious  or inflammatory etiology. However, the possibility of metastatic disease is not excluded. At the very least, follow-up standing PA and lateral chest radiograph is recommended in 2-3 weeks to ensure the stability or resolution of these findings. If of immediate clinical concern, this could be further evaluated with chest CT. Electronically Signed   By: Trudie Reed M.D.   On: 12/29/2019 18:15   CT ANGIO CHEST PE W OR WO CONTRAST  Result Date: 12/30/2019 CLINICAL DATA:  Elevated D-dimer. Short of breath. EXAM: CT ANGIOGRAPHY CHEST WITH CONTRAST TECHNIQUE: Multidetector CT imaging of the chest was performed using the standard protocol during bolus administration of intravenous contrast. Multiplanar CT image reconstructions and MIPs were obtained to evaluate the vascular anatomy. CONTRAST:  OMNIPAQUE IOHEXOL 350 MG/ML SOLN COMPARISON:  None. FINDINGS: Cardiovascular: No filling defects within the pulmonary arteries arteries to suggest acute pulmonary embolism. Coronary artery calcification and aortic atherosclerotic calcification. Mediastinum/Nodes: No axillary or supraclavicular adenopathy. No mediastinal hilar adenopathy. No pericardial effusion Lungs/Pleura: No pulmonary infarction. No infiltrate. No pulmonary edema. Subpleural focus of ground-glass density and nodularity in the peripheral RIGHT upper lobe on image 62/2 measures 1 cm. This appears to be a small be a small focus of infection inflammation. Mild basilar atelectasis on the RIGHT and small effusion. Upper Abdomen: Limited view of the liver, kidneys, pancreas are unremarkable. Normal adrenal glands. Musculoskeletal: No aggressive osseous lesion. Review of the MIP images confirms the above findings. IMPRESSION: 1. No evidence acute pulmonary embolism. 2. Small focus of infection inflammation in the RIGHT upper lobe. 3. Small RIGHT effusion. 4. Coronary artery calcification and aortic atherosclerotic calcification. Electronically Signed   By:  Genevive Bi M.D.   On: 12/30/2019 04:53   ECHOCARDIOGRAM COMPLETE  Result Date: 12/30/2019   ECHOCARDIOGRAM REPORT   Patient Name:   Danny Mccormick Date of Exam: 12/30/2019 Medical Rec #:  [id number]        Height:       66.7 in Accession #:    [id number]       Weight:       280.0 lb Date of Birth:  13-Jul-1956       BSA:          2.33 m Patient Age:    63 years         BP:           113/87 mmHg Patient Gender: M                HR:           99 bpm. Exam Location:  Inpatient Procedure: 2D Echo, Color Doppler and Cardiac Doppler Indications:    I48.92* Unspecified atrial flutter  History:        Patient has no prior history of Echocardiogram examinations.                 Arrythmias:Atrial Flutter; Risk Factors:Hypertension.  Sonographer:    Irving Burton Senior RDCS Referring Phys: [id number] JAMES KIM IMPRESSIONS  1. Left ventricular ejection fraction, by visual estimation, is 30 to 35%. The left ventricle has severely decreased function. There is mildly increased left ventricular hypertrophy.  2. Left ventricular diastolic function could  not be evaluated.  3. The left ventricle demonstrates global hypokinesis.  4. Global right ventricle has mildly reduced systolic function.The right ventricular size is normal. No increase in right ventricular wall thickness.  5. Left atrial size was moderately dilated.  6. Right atrial size was moderately dilated.  7. The mitral valve is abnormal. Mild mitral valve regurgitation.  8. The tricuspid valve is grossly normal.  9. The aortic valve was not well visualized. Aortic valve regurgitation is not visualized. 10. The pulmonic valve was grossly normal. Pulmonic valve regurgitation is trivial. 11. Moderately elevated pulmonary artery systolic pressure. 12. The inferior vena cava is dilated in size with <50% respiratory variability, suggesting right atrial pressure of 15 mmHg. FINDINGS  Left Ventricle: Left ventricular ejection fraction, by visual estimation, is 30 to 35%. The left  ventricle has severely decreased function. The left ventricle demonstrates global hypokinesis. There is mildly increased left ventricular hypertrophy. The left ventricular diastology could not be evaluated due to atrial fibrillation. Left ventricular diastolic function could not be evaluated. Right Ventricle: The right ventricular size is normal. No increase in right ventricular wall thickness. Global RV systolic function is has mildly reduced systolic function. The tricuspid regurgitant velocity is 2.96 m/s, and with an assumed right atrial pressure of 15 mmHg, the estimated right ventricular systolic pressure is moderately elevated at 49.9 mmHg. Left Atrium: Left atrial size was moderately dilated. Right Atrium: Right atrial size was moderately dilated Pericardium: There is no evidence of pericardial effusion. Mitral Valve: The mitral valve is abnormal. There is mild thickening of the mitral valve leaflet(s). Mild mitral valve regurgitation. Tricuspid Valve: The tricuspid valve is grossly normal. Tricuspid valve regurgitation is mild. Aortic Valve: The aortic valve was not well visualized. Aortic valve regurgitation is not visualized. Pulmonic Valve: The pulmonic valve was grossly normal. Pulmonic valve regurgitation is trivial. Pulmonic regurgitation is trivial. Aorta: The aortic root and ascending aorta are structurally normal, with no evidence of dilitation. Venous: The inferior vena cava is dilated in size with less than 50% respiratory variability, suggesting right atrial pressure of 15 mmHg. IAS/Shunts: No atrial level shunt detected by color flow Doppler.  LEFT VENTRICLE PLAX 2D LVIDd:         4.40 cm LVIDs:         4.20 cm LV PW:         1.40 cm LV IVS:        1.00 cm LVOT diam:     2.20 cm LV SV:         9 ml LV SV Index:   3.63 LVOT Area:     3.80 cm  LV Volumes (MOD) LV area d, A2C:    36.90 cm LV area d, A4C:    46.30 cm LV area s, A2C:    29.00 cm LV area s, A4C:    34.40 cm LV major d, A2C:    9.42 cm LV major d, A4C:   9.58 cm LV major s, A2C:   8.50 cm LV major s, A4C:   8.52 cm LV vol d, MOD A2C: 123.0 ml LV vol d, MOD A4C: 183.0 ml LV vol s, MOD A2C: 85.0 ml LV vol s, MOD A4C: 117.0 ml LV SV MOD A2C:     38.0 ml LV SV MOD A4C:     183.0 ml LV SV MOD BP:      50.9 ml RIGHT VENTRICLE TAPSE (M-mode): 1.2 cm LEFT ATRIUM  Index       RIGHT ATRIUM           Index LA diam:        5.00 cm  2.15 cm/m  RA Area:     28.50 cm LA Vol (A2C):   105.0 ml 45.14 ml/m RA Volume:   101.00 ml 43.42 ml/m LA Vol (A4C):   95.2 ml  40.93 ml/m LA Biplane Vol: 103.0 ml 44.28 ml/m  AORTIC VALVE LVOT Vmax:   49.00 cm/s LVOT Vmean:  37.400 cm/s LVOT VTI:    0.079 m  AORTA Ao Root diam: 3.50 cm Ao Asc diam:  3.30 cm TRICUSPID VALVE TR Peak grad:   34.9 mmHg TR Vmax:        309.00 cm/s  SHUNTS Systemic VTI:  0.08 m Systemic Diam: 2.20 cm  Zoila Shutter MD Electronically signed by Zoila Shutter MD Signature Date/Time: 12/30/2019/2:58:35 PM    Final    ECHO TEE  Result Date: 01/01/2020   TRANSESOPHOGEAL ECHO REPORT   Patient Name:   THURMON MIZELL Date of Exam: 01/01/2020 Medical Rec #:  [id number]        Height:       66.7 in Accession #:    [id number]       Weight:       266.0 lb Date of Birth:  19-Jul-1956       BSA:          2.28 m Patient Age:    63 years         BP:           156/110 mmHg Patient Gender: M                HR:           130 bpm. Exam Location:  Inpatient  Procedure: Transesophageal Echo Indications:    ATRIAL FLUTTER  History:        Patient has prior history of Echocardiogram examinations, most                 recent 12/30/2019.  Sonographer:    Celene Skeen RDCS (AE) Referring Phys: [id number] RHONDA G BARRETT  PROCEDURE: The transesophogeal probe was passed through the esophogus of the patient. The patient developed no complications during the procedure. IMPRESSIONS  1. Left ventricular ejection fraction, by visual estimation, is 20 to 25%. The left ventricle has severely decreased function.  There is mildly increased left ventricular hypertrophy. No left ventricular apical thrombus seen.  2. The left ventricle demonstrates global hypokinesis.  3. Global right ventricle has moderately reduced systolic function.The right ventricular size is normal. No increase in right ventricular wall thickness.  4. Left atrial size was moderately dilated. No LA or LAA thrombus seen.  5. Right atrial size was severely dilated.  6. The mitral valve is normal in structure. Mild mitral valve regurgitation.  7. Small patent foramen ovale with predominantly right to left shunting across the atrial septum with inspiration.  8. Evidence of atrial level shunting detected by color flow Doppler.  9. There is mild dilatation at the level of the sinuses of Valsalva measuring 42 mm. 10. The aortic valve is tricuspid. Aortic valve regurgitation is not visualized. 11. The tricuspid valve is normal in structure. 12. The pulmonic valve was normal in structure. Pulmonic valve regurgitation is trivial. 13. Trivial pericardial effusion is present. FINDINGS  Left Ventricle: Left ventricular ejection fraction, by visual estimation, is 20 to 25%. The left ventricle  has severely decreased function. The left ventricle demonstrates global hypokinesis. There is mildly increased left ventricular hypertrophy. Right Ventricle: The right ventricular size is normal. No increase in right ventricular wall thickness. Global RV systolic function is has moderately reduced systolic function. Left Atrium: Left atrial size was moderately dilated. There is spontaneous echo contrast seen in the left atrial cavity and left atrial appendage. Right Atrium: Right atrial size was severely dilated Pericardium: Trivial pericardial effusion is present. Mitral Valve: The mitral valve is normal in structure. Mild mitral valve regurgitation. Tricuspid Valve: The tricuspid valve is normal in structure. Tricuspid valve regurgitation is mild. Aortic Valve: The aortic valve is  tricuspid. Aortic valve regurgitation is not visualized. Pulmonic Valve: The pulmonic valve was normal in structure. Pulmonic valve regurgitation is trivial. Aorta: Aortic dilatation noted. There is mild dilatation at the level of the sinuses of Valsalva measuring 42 mm. Shunts: A small patent foramen ovale is detected with predominantly right to left shunting across the atrial septum. Evidence of atrial level shunting detected by color flow Doppler.   AORTA                  Normals Ao Sinus diam: 4.20 cm Ao Asc diam:   3.60 cm 31 mm  Weston Brass MD Electronically signed by Weston Brass MD Signature Date/Time: 01/01/2020/12:34:10 PM    Final        Subjective: Patient seen and examined at bedside.  Denies chest pain or worsening shortness breath.  No overnight fever or vomiting.  Discharge Exam: Vitals:   01/01/20 1202 01/01/20 1212  BP: 118/74 121/82  Pulse: 89 91  Resp: 18 19  Temp:    SpO2: 94% 94%    General exam: No acute distress.  Appears calm and comfortable.  Respiratory system: Bilateral decreased breath sounds at bases with bibasilar crackles.  No wheezing  cardiovascular system: S1 & S2 heard, currently rate controlled Gastrointestinal system: Abdomen is morbidly obese, nondistended, soft and nontender. Normal bowel sounds heard. Extremities: No cyanosis; trace lower extremity edema present    The results of significant diagnostics from this hospitalization (including imaging, microbiology, ancillary and laboratory) are listed below for reference.     Microbiology: Recent Results (from the past 240 hour(s))  Novel Coronavirus, NAA ([facility])     Status: None   Collection Time: 12/26/19 10:51 AM   Specimen: Nasopharyngeal(NP) swabs in vial transport medium   NASOPHARYNGE  TESTING  Result Value Ref Range Status   SARS-CoV-2, NAA Not Detected Not Detected Final    Comment: This nucleic acid amplification test was developed and its performance characteristics  determined by [employer]. Nucleic acid amplification tests include PCR and TMA. This test has not been FDA cleared or approved. This test has been authorized by FDA under an Emergency Use Authorization (EUA). This test is only authorized for the duration of time the declaration that circumstances exist justifying the authorization of the emergency use of in vitro diagnostic tests for detection of SARS-CoV-2 virus and/or diagnosis of COVID-19 infection under section 564(b)(1) of the Act, 21 U.S.C. 542HCW-2(B) (1), unless the authorization is terminated or revoked sooner. When diagnostic testing is negative, the possibility of a false negative result should be considered in the context of a patient's recent exposures and the presence of clinical signs and symptoms consistent with COVID-19. An individual without symptoms of COVID-19 and who is not shedding SARS-CoV-2 virus would  expect to have a negative (not detected) result in this assay.  Respiratory Panel by RT PCR (Flu A&B, Covid) - Nasopharyngeal Swab     Status: None   Collection Time: 12/29/19  9:32 PM   Specimen: Nasopharyngeal Swab  Result Value Ref Range Status   SARS Coronavirus 2 by RT PCR NEGATIVE NEGATIVE Final    Comment: (NOTE) SARS-CoV-2 target nucleic acids are NOT DETECTED. The SARS-CoV-2 RNA is generally detectable in upper respiratoy specimens during the acute phase of infection. The lowest concentration of SARS-CoV-2 viral copies this assay can detect is 131 copies/mL. A negative result does not preclude SARS-Cov-2 infection and should not be used as the sole basis for treatment or other patient management decisions. A negative result may occur with  improper specimen collection/handling, submission of specimen other than nasopharyngeal swab, presence of viral mutation(s) within the areas targeted by this assay, and inadequate number of viral copies (<131 copies/mL). A negative result must be combined  with clinical observations, patient history, and epidemiological information. The expected result is Negative. Fact Sheet for Patients:  https://www.moore.com/ Fact Sheet for Healthcare Providers:  https://www.young.biz/ This test is not yet ap proved or cleared by the [place] FDA and  has been authorized for detection and/or diagnosis of SARS-CoV-2 by FDA under an Emergency Use Authorization (EUA). This EUA will remain  in effect (meaning this test can be used) for the duration of the COVID-19 declaration under Section 564(b)(1) of the Act, 21 U.S.C. section 360bbb-3(b)(1), unless the authorization is terminated or revoked sooner.    Influenza A by PCR NEGATIVE NEGATIVE Final   Influenza B by PCR NEGATIVE NEGATIVE Final    Comment: (NOTE) The Xpert Xpress SARS-CoV-2/FLU/RSV assay is intended as an aid in  the diagnosis of influenza from Nasopharyngeal swab specimens and  should not be used as a sole basis for treatment. Nasal washings and  aspirates are unacceptable for Xpert Xpress SARS-CoV-2/FLU/RSV  testing. Fact Sheet for Patients: https://www.moore.com/ Fact Sheet for Healthcare Providers: https://www.young.biz/ This test is not yet approved or cleared by the [place] FDA and  has been authorized for detection and/or diagnosis of SARS-CoV-2 by  FDA under an Emergency Use Authorization (EUA). This EUA will remain  in effect (meaning this test can be used) for the duration of the  Covid-19 declaration under Section 564(b)(1) of the Act, 21  U.S.C. section 360bbb-3(b)(1), unless the authorization is  terminated or revoked. Performed at [facility], [address], [place], [place] [address]      Labs: BNP (last 3 results) Recent Labs    12/29/19 1643  BNP 34.0   Basic Metabolic Panel: Recent Labs  Lab 12/29/19 1643 12/30/19 0639 01/01/20 0303  NA 137 137 138  K 4.5 4.3 4.4   CL 105 102 103  CO2 23 22 23   GLUCOSE 105* 113* 102*  BUN 16 15 25*  CREATININE 0.90 0.83 1.08  CALCIUM 9.4 9.6 9.0  MG  --   --  2.0   Liver Function Tests: Recent Labs  Lab 12/29/19 1643 12/30/19 0639  AST 36 37  ALT 37 38  ALKPHOS 51 52  BILITOT 0.9 1.1  PROT 7.1 7.0  ALBUMIN 4.1 3.9   No results for input(s): LIPASE, AMYLASE in the last 168 hours. No results for input(s): AMMONIA in the last 168 hours. CBC: Recent Labs  Lab 12/29/19 1643 12/30/19 0639  WBC 9.6 8.6  HGB 13.9 13.7  HCT 43.7 43.4  MCV 93.2 93.5  PLT 271 336   Cardiac Enzymes: No results for input(s): CKTOTAL,  CKMB, CKMBINDEX, TROPONINI in the last 168 hours. BNP: Invalid input(s): POCBNP CBG: No results for input(s): GLUCAP in the last 168 hours. D-Dimer Recent Labs    12/29/19 1643  DDIMER 1.47*   Hgb A1c No results for input(s): HGBA1C in the last 72 hours. Lipid Profile No results for input(s): CHOL, HDL, LDLCALC, TRIG, CHOLHDL, LDLDIRECT in the last 72 hours. Thyroid function studies Recent Labs    12/30/19 0639  TSH 0.790   Anemia work up No results for input(s): VITAMINB12, FOLATE, FERRITIN, TIBC, IRON, RETICCTPCT in the last 72 hours. Urinalysis No results found for: COLORURINE, APPEARANCEUR, LABSPEC, PHURINE, GLUCOSEU, HGBUR, BILIRUBINUR, KETONESUR, PROTEINUR, UROBILINOGEN, NITRITE, LEUKOCYTESUR Sepsis Labs Invalid input(s): PROCALCITONIN,  WBC,  LACTICIDVEN Microbiology Recent Results (from the past 240 hour(s))  Novel Coronavirus, NAA ([facility])     Status: None   Collection Time: 12/26/19 10:51 AM   Specimen: Nasopharyngeal(NP) swabs in vial transport medium   NASOPHARYNGE  TESTING  Result Value Ref Range Status   SARS-CoV-2, NAA Not Detected Not Detected Final    Comment: This nucleic acid amplification test was developed and its performance characteristics determined by [employer]. Nucleic acid amplification tests include PCR and TMA. This test has not  been FDA cleared or approved. This test has been authorized by FDA under an Emergency Use Authorization (EUA). This test is only authorized for the duration of time the declaration that circumstances exist justifying the authorization of the emergency use of in vitro diagnostic tests for detection of SARS-CoV-2 virus and/or diagnosis of COVID-19 infection under section 564(b)(1) of the Act, 21 U.S.C. 161WRU-0(A) (1), unless the authorization is terminated or revoked sooner. When diagnostic testing is negative, the possibility of a false negative result should be considered in the context of a patient's recent exposures and the presence of clinical signs and symptoms consistent with COVID-19. An individual without symptoms of COVID-19 and who is not shedding SARS-CoV-2 virus would  expect to have a negative (not detected) result in this assay.   Respiratory Panel by RT PCR (Flu A&B, Covid) - Nasopharyngeal Swab     Status: None   Collection Time: 12/29/19  9:32 PM   Specimen: Nasopharyngeal Swab  Result Value Ref Range Status   SARS Coronavirus 2 by RT PCR NEGATIVE NEGATIVE Final    Comment: (NOTE) SARS-CoV-2 target nucleic acids are NOT DETECTED. The SARS-CoV-2 RNA is generally detectable in upper respiratoy specimens during the acute phase of infection. The lowest concentration of SARS-CoV-2 viral copies this assay can detect is 131 copies/mL. A negative result does not preclude SARS-Cov-2 infection and should not be used as the sole basis for treatment or other patient management decisions. A negative result may occur with  improper specimen collection/handling, submission of specimen other than nasopharyngeal swab, presence of viral mutation(s) within the areas targeted by this assay, and inadequate number of viral copies (<131 copies/mL). A negative result must be combined with clinical observations, patient history, and epidemiological information. The expected result is  Negative. Fact Sheet for Patients:  https://www.moore.com/ Fact Sheet for Healthcare Providers:  https://www.young.biz/ This test is not yet ap proved or cleared by the [place] FDA and  has been authorized for detection and/or diagnosis of SARS-CoV-2 by FDA under an Emergency Use Authorization (EUA). This EUA will remain  in effect (meaning this test can be used) for the duration of the COVID-19 declaration under Section 564(b)(1) of the Act, 21 U.S.C. section 360bbb-3(b)(1), unless the authorization is terminated or revoked sooner.  Influenza A by PCR NEGATIVE NEGATIVE Final   Influenza B by PCR NEGATIVE NEGATIVE Final    Comment: (NOTE) The Xpert Xpress SARS-CoV-2/FLU/RSV assay is intended as an aid in  the diagnosis of influenza from Nasopharyngeal swab specimens and  should not be used as a sole basis for treatment. Nasal washings and  aspirates are unacceptable for Xpert Xpress SARS-CoV-2/FLU/RSV  testing. Fact Sheet for Patients: PinkCheek.be Fact Sheet for Healthcare Providers: GravelBags.it This test is not yet approved or cleared by the [place] FDA and  has been authorized for detection and/or diagnosis of SARS-CoV-2 by  FDA under an Emergency Use Authorization (EUA). This EUA will remain  in effect (meaning this test can be used) for the duration of the  Covid-19 declaration under Section 564(b)(1) of the Act, 21  U.S.C. section 360bbb-3(b)(1), unless the authorization is  terminated or revoked. Performed at [facility], [address] [place],  [address]      Time coordinating discharge: 35 minutes  SIGNED:   Aline August, MD  [facility] 01/01/2020, 12:57 PM

## 2020-01-01 NOTE — Progress Notes (Addendum)
Progress Note  Patient Name: Danny Mccormick Date of Encounter: 01/01/2020  Primary Cardiologist: Thurmon Fair, MD   Subjective   A little frustrated because HR not coming down. No CP, SOB, presyncope.  Started back taking [place] a month ago, feels the Aflutter started about the same time.   Inpatient Medications    Scheduled Meds: . apixaban  5 mg Oral BID  . metoprolol tartrate  75 mg Oral BID  . sodium chloride flush  3 mL Intravenous Q12H   Continuous Infusions: . sodium chloride    . diltiazem (CARDIZEM) infusion 15 mg/hr (12/31/19 0653)   PRN Meds: sodium chloride, acetaminophen **OR** acetaminophen, sodium chloride flush   Vital Signs    Vitals:   12/31/19 2300 01/01/20 0306 01/01/20 0514 01/01/20 0525  BP: 110/79 116/68 (!) 144/91   Pulse:  65  (!) 133  Resp: 18 20    Temp: 97.6 F (36.4 C) 98.4 F (36.9 C)    TempSrc: Oral Oral    SpO2: 95% 93%    Weight:  120.7 kg    Height:        Intake/Output Summary (Last 24 hours) at 01/01/2020 0745 Last data filed at 12/31/2019 2305 Gross per 24 hour  Intake 3 ml  Output --  Net 3 ml   Last 3 Weights 01/01/2020 12/29/2019  Weight (lbs) 266 lb 280 lb  Weight (kg) 120.657 kg 127.007 kg      Telemetry    Atrial flutter, RVR- Personally Reviewed  ECG    None new- Personally Reviewed  Physical Exam   General: Well developed, well nourished, male in no acute distress Head: Eyes PERRLA, Head normocephalic and atraumatic Lungs: clear bilaterally to auscultation. Heart: Irreg R&r S1 S2, without rub or gallop. No murmur. 4/4 extremity pulses are 2+ & equal. No JVD. Abdomen: Bowel sounds are present, abdomen soft and non-tender without masses or  hernias noted. Msk: Normal strength and tone for age. Extremities: No clubbing, cyanosis, trace pedal edema.    Skin:  No rashes or lesions noted. Psoriasis plaques noted Neuro: Alert and oriented X 3. Psych:  Good affect, responds appropriately  Labs     High Sensitivity Troponin:   Recent Labs  Lab 12/29/19 1643 12/29/19 2217  TROPONINIHS 19* 26*      Chemistry Recent Labs  Lab 12/29/19 1643 12/30/19 0639 01/01/20 0303  NA 137 137 138  K 4.5 4.3 4.4  CL 105 102 103  CO2 23 22 23   GLUCOSE 105* 113* 102*  BUN 16 15 25*  CREATININE 0.90 0.83 1.08  CALCIUM 9.4 9.6 9.0  PROT 7.1 7.0  --   ALBUMIN 4.1 3.9  --   AST 36 37  --   ALT 37 38  --   ALKPHOS 51 52  --   BILITOT 0.9 1.1  --   GFRNONAA >60 >60 >60  GFRAA >60 >60 >60  ANIONGAP 9 13 12      Hematology Recent Labs  Lab 12/29/19 1643 12/30/19 0639  WBC 9.6 8.6  RBC 4.69 4.64  HGB 13.9 13.7  HCT 43.7 43.4  MCV 93.2 93.5  MCH 29.6 29.5  MCHC 31.8 31.6  RDW 13.2 13.2  PLT 271 336    BNP Recent Labs  Lab 12/29/19 1643  BNP 34.0     DDimer  Recent Labs  Lab 12/29/19 1643  DDIMER 1.47*     Radiology    ECHOCARDIOGRAM COMPLETE  Result Date: 12/30/2019   ECHOCARDIOGRAM REPORT  Patient Name:   Danny Mccormick Date of Exam: 12/30/2019 Medical Rec #:  [id number]        Height:       66.7 in Accession #:    [id number]       Weight:       280.0 lb Date of Birth:  Jul 10, 1956       BSA:          2.33 m Patient Age:    63 years         BP:           113/87 mmHg Patient Gender: M                HR:           99 bpm. Exam Location:  Inpatient Procedure: 2D Echo, Color Doppler and Cardiac Doppler Indications:    I48.92* Unspecified atrial flutter  History:        Patient has no prior history of Echocardiogram examinations.                 Arrythmias:Atrial Flutter; Risk Factors:Hypertension.  Sonographer:    Irving Burton Senior RDCS Referring Phys: [id number] JAMES KIM IMPRESSIONS  1. Left ventricular ejection fraction, by visual estimation, is 30 to 35%. The left ventricle has severely decreased function. There is mildly increased left ventricular hypertrophy.  2. Left ventricular diastolic function could not be evaluated.  3. The left ventricle demonstrates global hypokinesis.   4. Global right ventricle has mildly reduced systolic function.The right ventricular size is normal. No increase in right ventricular wall thickness.  5. Left atrial size was moderately dilated.  6. Right atrial size was moderately dilated.  7. The mitral valve is abnormal. Mild mitral valve regurgitation.  8. The tricuspid valve is grossly normal.  9. The aortic valve was not well visualized. Aortic valve regurgitation is not visualized. 10. The pulmonic valve was grossly normal. Pulmonic valve regurgitation is trivial. 11. Moderately elevated pulmonary artery systolic pressure. 12. The inferior vena cava is dilated in size with <50% respiratory variability, suggesting right atrial pressure of 15 mmHg. FINDINGS  Left Ventricle: Left ventricular ejection fraction, by visual estimation, is 30 to 35%. The left ventricle has severely decreased function. The left ventricle demonstrates global hypokinesis. There is mildly increased left ventricular hypertrophy. The left ventricular diastology could not be evaluated due to atrial fibrillation. Left ventricular diastolic function could not be evaluated. Right Ventricle: The right ventricular size is normal. No increase in right ventricular wall thickness. Global RV systolic function is has mildly reduced systolic function. The tricuspid regurgitant velocity is 2.96 m/s, and with an assumed right atrial pressure of 15 mmHg, the estimated right ventricular systolic pressure is moderately elevated at 49.9 mmHg. Left Atrium: Left atrial size was moderately dilated. Right Atrium: Right atrial size was moderately dilated Pericardium: There is no evidence of pericardial effusion. Mitral Valve: The mitral valve is abnormal. There is mild thickening of the mitral valve leaflet(s). Mild mitral valve regurgitation. Tricuspid Valve: The tricuspid valve is grossly normal. Tricuspid valve regurgitation is mild. Aortic Valve: The aortic valve was not well visualized. Aortic valve  regurgitation is not visualized. Pulmonic Valve: The pulmonic valve was grossly normal. Pulmonic valve regurgitation is trivial. Pulmonic regurgitation is trivial. Aorta: The aortic root and ascending aorta are structurally normal, with no evidence of dilitation. Venous: The inferior vena cava is dilated in size with less than 50% respiratory variability, suggesting right atrial pressure of 15 mmHg. IAS/Shunts:  No atrial level shunt detected by color flow Doppler.  LEFT VENTRICLE PLAX 2D LVIDd:         4.40 cm LVIDs:         4.20 cm LV PW:         1.40 cm LV IVS:        1.00 cm LVOT diam:     2.20 cm LV SV:         9 ml LV SV Index:   3.63 LVOT Area:     3.80 cm  LV Volumes (MOD) LV area d, A2C:    36.90 cm LV area d, A4C:    46.30 cm LV area s, A2C:    29.00 cm LV area s, A4C:    34.40 cm LV major d, A2C:   9.42 cm LV major d, A4C:   9.58 cm LV major s, A2C:   8.50 cm LV major s, A4C:   8.52 cm LV vol d, MOD A2C: 123.0 ml LV vol d, MOD A4C: 183.0 ml LV vol s, MOD A2C: 85.0 ml LV vol s, MOD A4C: 117.0 ml LV SV MOD A2C:     38.0 ml LV SV MOD A4C:     183.0 ml LV SV MOD BP:      50.9 ml RIGHT VENTRICLE TAPSE (M-mode): 1.2 cm LEFT ATRIUM              Index       RIGHT ATRIUM           Index LA diam:        5.00 cm  2.15 cm/m  RA Area:     28.50 cm LA Vol (A2C):   105.0 ml 45.14 ml/m RA Volume:   101.00 ml 43.42 ml/m LA Vol (A4C):   95.2 ml  40.93 ml/m LA Biplane Vol: 103.0 ml 44.28 ml/m  AORTIC VALVE LVOT Vmax:   49.00 cm/s LVOT Vmean:  37.400 cm/s LVOT VTI:    0.079 m  AORTA Ao Root diam: 3.50 cm Ao Asc diam:  3.30 cm TRICUSPID VALVE TR Peak grad:   34.9 mmHg TR Vmax:        309.00 cm/s  SHUNTS Systemic VTI:  0.08 m Systemic Diam: 2.20 cm  Kenneth Hilty MD Electronically signed by Kenneth Hilty MD Signature Date/Time: 12/30/2019/2:58:35 PM    Final     Cardiac Studies   TTE 12/29/18  1. Left ventricular ejection fraction, by visual estimation, is 30 to 35%. The left ventricle has severely decreased  function. There is mildly increased left ventricular hypertrophy.  2. Left ventricular diastolic function could not be evaluated.  3. The left ventricle demonstrates global hypokinesis.  4. Global right ventricle has mildly reduced systolic function.The right ventricular size is normal. No increase in right ventricular wall thickness.  5. Left atrial size was moderately dilated.  6. Right atrial size was moderately dilated.  7. The mitral valve is abnormal. Mild mitral valve regurgitation.  8. The tricuspid valve is grossly normal.  9. The aortic valve was not well visualized. Aortic valve regurgitation is not visualized. 10. The pulmonic valve was grossly normal. Pulmonic valve regurgitation is trivial. 11. Moderately elevated pulmonary artery systolic pressure. 12. The inferior vena cava is dilated in size with <50% respiratory variability, suggesting right atrial pressure of 15 mmHg.  Patient Profile     63 y.o. male with a history of hypertension presented to the hospital 01/15 with shortness of breath, found to be in   atrial flutter  Assessment & Plan    1.  Typical appearing atrial flutter: - initially on IV Cardizem, transitioned to Lopressor after EF found to be low - BP tolerates 75 mg dose, but HR still not well-controlled - pt eager to get better and get out of here - has had 4 doses Eliquis, 5th this am - will schedule TEE/DCCV for this am, 11 am w/ Dr Margaretann Loveless - on preliminary pharm review of Otezla, no reports of it causing Afib  2.  Acute systolic heart failure:  - EF 30-35% w/ global HK, possibly tachycardia-induced CM - mild trop elevation more c/w CHF than ACS - change BB to Toprol XL or Coreg once in SR - got 40 mg IV Lasix yesterday - only mild volume overload on exam, discuss another dose of IV Lasix vs starting po rx - encourage low Na+ diet  For questions or updates, please contact [facility] Please consult [email or web address] for contact info under          Signed, Rosaria Ferries, PA-C  01/01/2020, 7:45 AM    I have seen and examined the patient along with Rosaria Ferries , PA-C.  I have reviewed the chart, notes and new data.  I agree with PA/NP's note.  Key new complaints: Denies angina, dyspnea or dizziness at rest.  He does snore, but denies daytime hypersomnolence and feels well rested when he wakes up in the morning. Key examination changes: Obese, rapid regular rhythm, otherwise normal cardiovascular exam Key new findings / data: Echocardiogram shows moderate to severe reduction in LV systolic function with global hypokinesis and EF 30-35%, no significant valvular abnormalities  PLAN: TEE-cardioversion today. 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 Good candidate for radiofrequency ablation of the atrial flutter, but also encouraged to lose weight. Since he is unaware of palpitations, would be a good idea for him to have at home rhythm monitor such as a Kardiamobile device.  Sanda Klein, MD, [place] [phone] 01/01/2020, 8:54 AM

## 2020-01-01 NOTE — H&P (View-Only) (Signed)
Progress Note  Patient Name: Danny Mccormick Date of Encounter: 01/01/2020  Primary Cardiologist: Thurmon Fair, MD   Subjective   A little frustrated because HR not coming down. No CP, SOB, presyncope.  Started back taking [place] a month ago, feels the Aflutter started about the same time.   Inpatient Medications    Scheduled Meds: . apixaban  5 mg Oral BID  . metoprolol tartrate  75 mg Oral BID  . sodium chloride flush  3 mL Intravenous Q12H   Continuous Infusions: . sodium chloride    . diltiazem (CARDIZEM) infusion 15 mg/hr (12/31/19 0653)   PRN Meds: sodium chloride, acetaminophen **OR** acetaminophen, sodium chloride flush   Vital Signs    Vitals:   12/31/19 2300 01/01/20 0306 01/01/20 0514 01/01/20 0525  BP: 110/79 116/68 (!) 144/91   Pulse:  65  (!) 133  Resp: 18 20    Temp: 97.6 F (36.4 C) 98.4 F (36.9 C)    TempSrc: Oral Oral    SpO2: 95% 93%    Weight:  120.7 kg    Height:        Intake/Output Summary (Last 24 hours) at 01/01/2020 0745 Last data filed at 12/31/2019 2305 Gross per 24 hour  Intake 3 ml  Output --  Net 3 ml   Last 3 Weights 01/01/2020 12/29/2019  Weight (lbs) 266 lb 280 lb  Weight (kg) 120.657 kg 127.007 kg      Telemetry    Atrial flutter, RVR- Personally Reviewed  ECG    None new- Personally Reviewed  Physical Exam   General: Well developed, well nourished, male in no acute distress Head: Eyes PERRLA, Head normocephalic and atraumatic Lungs: clear bilaterally to auscultation. Heart: Irreg R&r S1 S2, without rub or gallop. No murmur. 4/4 extremity pulses are 2+ & equal. No JVD. Abdomen: Bowel sounds are present, abdomen soft and non-tender without masses or  hernias noted. Msk: Normal strength and tone for age. Extremities: No clubbing, cyanosis, trace pedal edema.    Skin:  No rashes or lesions noted. Psoriasis plaques noted Neuro: Alert and oriented X 3. Psych:  Good affect, responds appropriately  Labs     High Sensitivity Troponin:   Recent Labs  Lab 12/29/19 1643 12/29/19 2217  TROPONINIHS 19* 26*      Chemistry Recent Labs  Lab 12/29/19 1643 12/30/19 0639 01/01/20 0303  NA 137 137 138  K 4.5 4.3 4.4  CL 105 102 103  CO2 23 22 23   GLUCOSE 105* 113* 102*  BUN 16 15 25*  CREATININE 0.90 0.83 1.08  CALCIUM 9.4 9.6 9.0  PROT 7.1 7.0  --   ALBUMIN 4.1 3.9  --   AST 36 37  --   ALT 37 38  --   ALKPHOS 51 52  --   BILITOT 0.9 1.1  --   GFRNONAA >60 >60 >60  GFRAA >60 >60 >60  ANIONGAP 9 13 12      Hematology Recent Labs  Lab 12/29/19 1643 12/30/19 0639  WBC 9.6 8.6  RBC 4.69 4.64  HGB 13.9 13.7  HCT 43.7 43.4  MCV 93.2 93.5  MCH 29.6 29.5  MCHC 31.8 31.6  RDW 13.2 13.2  PLT 271 336    BNP Recent Labs  Lab 12/29/19 1643  BNP 34.0     DDimer  Recent Labs  Lab 12/29/19 1643  DDIMER 1.47*     Radiology    ECHOCARDIOGRAM COMPLETE  Result Date: 12/30/2019   ECHOCARDIOGRAM REPORT  Patient Name:   Danny Mccormick Date of Exam: 12/30/2019 Medical Rec #:  [id number]        Height:       66.7 in Accession #:    [id number]       Weight:       280.0 lb Date of Birth:  Jul 10, 1956       BSA:          2.33 m Patient Age:    63 years         BP:           113/87 mmHg Patient Gender: M                HR:           99 bpm. Exam Location:  Inpatient Procedure: 2D Echo, Color Doppler and Cardiac Doppler Indications:    I48.92* Unspecified atrial flutter  History:        Patient has no prior history of Echocardiogram examinations.                 Arrythmias:Atrial Flutter; Risk Factors:Hypertension.  Sonographer:    Irving Burton Senior RDCS Referring Phys: [id number] JAMES KIM IMPRESSIONS  1. Left ventricular ejection fraction, by visual estimation, is 30 to 35%. The left ventricle has severely decreased function. There is mildly increased left ventricular hypertrophy.  2. Left ventricular diastolic function could not be evaluated.  3. The left ventricle demonstrates global hypokinesis.   4. Global right ventricle has mildly reduced systolic function.The right ventricular size is normal. No increase in right ventricular wall thickness.  5. Left atrial size was moderately dilated.  6. Right atrial size was moderately dilated.  7. The mitral valve is abnormal. Mild mitral valve regurgitation.  8. The tricuspid valve is grossly normal.  9. The aortic valve was not well visualized. Aortic valve regurgitation is not visualized. 10. The pulmonic valve was grossly normal. Pulmonic valve regurgitation is trivial. 11. Moderately elevated pulmonary artery systolic pressure. 12. The inferior vena cava is dilated in size with <50% respiratory variability, suggesting right atrial pressure of 15 mmHg. FINDINGS  Left Ventricle: Left ventricular ejection fraction, by visual estimation, is 30 to 35%. The left ventricle has severely decreased function. The left ventricle demonstrates global hypokinesis. There is mildly increased left ventricular hypertrophy. The left ventricular diastology could not be evaluated due to atrial fibrillation. Left ventricular diastolic function could not be evaluated. Right Ventricle: The right ventricular size is normal. No increase in right ventricular wall thickness. Global RV systolic function is has mildly reduced systolic function. The tricuspid regurgitant velocity is 2.96 m/s, and with an assumed right atrial pressure of 15 mmHg, the estimated right ventricular systolic pressure is moderately elevated at 49.9 mmHg. Left Atrium: Left atrial size was moderately dilated. Right Atrium: Right atrial size was moderately dilated Pericardium: There is no evidence of pericardial effusion. Mitral Valve: The mitral valve is abnormal. There is mild thickening of the mitral valve leaflet(s). Mild mitral valve regurgitation. Tricuspid Valve: The tricuspid valve is grossly normal. Tricuspid valve regurgitation is mild. Aortic Valve: The aortic valve was not well visualized. Aortic valve  regurgitation is not visualized. Pulmonic Valve: The pulmonic valve was grossly normal. Pulmonic valve regurgitation is trivial. Pulmonic regurgitation is trivial. Aorta: The aortic root and ascending aorta are structurally normal, with no evidence of dilitation. Venous: The inferior vena cava is dilated in size with less than 50% respiratory variability, suggesting right atrial pressure of 15 mmHg. IAS/Shunts:  No atrial level shunt detected by color flow Doppler.  LEFT VENTRICLE PLAX 2D LVIDd:         4.40 cm LVIDs:         4.20 cm LV PW:         1.40 cm LV IVS:        1.00 cm LVOT diam:     2.20 cm LV SV:         9 ml LV SV Index:   3.63 LVOT Area:     3.80 cm  LV Volumes (MOD) LV area d, A2C:    36.90 cm LV area d, A4C:    46.30 cm LV area s, A2C:    29.00 cm LV area s, A4C:    34.40 cm LV major d, A2C:   9.42 cm LV major d, A4C:   9.58 cm LV major s, A2C:   8.50 cm LV major s, A4C:   8.52 cm LV vol d, MOD A2C: 123.0 ml LV vol d, MOD A4C: 183.0 ml LV vol s, MOD A2C: 85.0 ml LV vol s, MOD A4C: 117.0 ml LV SV MOD A2C:     38.0 ml LV SV MOD A4C:     183.0 ml LV SV MOD BP:      50.9 ml RIGHT VENTRICLE TAPSE (M-mode): 1.2 cm LEFT ATRIUM              Index       RIGHT ATRIUM           Index LA diam:        5.00 cm  2.15 cm/m  RA Area:     28.50 cm LA Vol (A2C):   105.0 ml 45.14 ml/m RA Volume:   101.00 ml 43.42 ml/m LA Vol (A4C):   95.2 ml  40.93 ml/m LA Biplane Vol: 103.0 ml 44.28 ml/m  AORTIC VALVE LVOT Vmax:   49.00 cm/s LVOT Vmean:  37.400 cm/s LVOT VTI:    0.079 m  AORTA Ao Root diam: 3.50 cm Ao Asc diam:  3.30 cm TRICUSPID VALVE TR Peak grad:   34.9 mmHg TR Vmax:        309.00 cm/s  SHUNTS Systemic VTI:  0.08 m Systemic Diam: 2.20 cm  Zoila Shutter MD Electronically signed by Zoila Shutter MD Signature Date/Time: 12/30/2019/2:58:35 PM    Final     Cardiac Studies   TTE 12/29/18  1. Left ventricular ejection fraction, by visual estimation, is 30 to 35%. The left ventricle has severely decreased  function. There is mildly increased left ventricular hypertrophy.  2. Left ventricular diastolic function could not be evaluated.  3. The left ventricle demonstrates global hypokinesis.  4. Global right ventricle has mildly reduced systolic function.The right ventricular size is normal. No increase in right ventricular wall thickness.  5. Left atrial size was moderately dilated.  6. Right atrial size was moderately dilated.  7. The mitral valve is abnormal. Mild mitral valve regurgitation.  8. The tricuspid valve is grossly normal.  9. The aortic valve was not well visualized. Aortic valve regurgitation is not visualized. 10. The pulmonic valve was grossly normal. Pulmonic valve regurgitation is trivial. 11. Moderately elevated pulmonary artery systolic pressure. 12. The inferior vena cava is dilated in size with <50% respiratory variability, suggesting right atrial pressure of 15 mmHg.  Patient Profile     64 y.o. male with a history of hypertension presented to the hospital 01/15 with shortness of breath, found to be in  atrial flutter  Assessment & Plan    1.  Typical appearing atrial flutter: - initially on IV Cardizem, transitioned to Lopressor after EF found to be low - BP tolerates 75 mg dose, but HR still not well-controlled - pt eager to get better and get out of here - has had 4 doses Eliquis, 5th this am - will schedule TEE/DCCV for this am, 11 am w/ Dr Margaretann Loveless - on preliminary pharm review of Otezla, no reports of it causing Afib  2.  Acute systolic heart failure:  - EF 30-35% w/ global HK, possibly tachycardia-induced CM - mild trop elevation more c/w CHF than ACS - change BB to Toprol XL or Coreg once in SR - got 40 mg IV Lasix yesterday - only mild volume overload on exam, discuss another dose of IV Lasix vs starting po rx - encourage low Na+ diet  For questions or updates, please contact [facility] Please consult [email or web address] for contact info under          Signed, Rosaria Ferries, PA-C  01/01/2020, 7:45 AM    I have seen and examined the patient along with Rosaria Ferries , PA-C.  I have reviewed the chart, notes and new data.  I agree with PA/NP's note.  Key new complaints: Denies angina, dyspnea or dizziness at rest.  He does snore, but denies daytime hypersomnolence and feels well rested when he wakes up in the morning. Key examination changes: Obese, rapid regular rhythm, otherwise normal cardiovascular exam Key new findings / data: Echocardiogram shows moderate to severe reduction in LV systolic function with global hypokinesis and EF 30-35%, no significant valvular abnormalities  PLAN: TEE-cardioversion today. 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 Good candidate for radiofrequency ablation of the atrial flutter, but also encouraged to lose weight. Since he is unaware of palpitations, would be a good idea for him to have at home rhythm monitor such as a Kardiamobile device.  Sanda Klein, MD, [place] [phone] 01/01/2020, 8:54 AM

## 2020-01-01 NOTE — Care Management (Signed)
1423 01-01-20 Eliquis 30 day free card provided to the patient. No further needs from Case Manager at this time. Gala Lewandowsky, RN,BSN Case Manager

## 2020-01-01 NOTE — Progress Notes (Signed)
Patient ID: Danny Mccormick, male   DOB: 07-15-1956, 64 y.o.   MRN: [id number]  PROGRESS NOTE    Danny Mccormick  NWG:[id number] DOB: 03-07-1956 DOA: 12/29/2019 PCP: Carylon Perches, MD   Brief Narrative:  CT 3-year-old male with history of hypertension presented with worsening shortness of breath.  On presentation, he was found to be in atrial flutter with rapid ventricular rate with heart rate of 139.  COVID-19 test was negative.  He was started on Cardizem drip and transferred to [facility]  Cardiology was consulted.  Assessment & Plan:   New diagnosis of paroxysmal atrial flutter with rapid ventricular rate -Currently still on Cardizem drip.  Also on metoprolol.  Heart rate improving.  Cardiology following and planning for possible cardioversion today. -CHADSVASc= 2 (HTN, Age).  Continue Eliquis  New diagnosis of cardiomyopathy/acute systolic heart failure -EF of 30 to 35%.  Cardiology following.  Received 1 dose of IV Lasix yesterday.  Will follow up with cardiology regarding further need for Lasix. -Strict input and output.  Daily weights.  Fluid restriction  Essential hypertension -Not on any meds as an outpatient.  Monitor blood pressure.  Currently on Cardizem drip and metoprolol.  Morbid obesity -Outpatient follow-up.  Lifestyle modification   DVT prophylaxis: Eliquis Code Status: Full Family Communication: Patient at bedside Disposition Plan: Home in 1 to 2 days once cleared by cardiology  Consultants: Cardiology  Procedures: Echo  Antimicrobials: None   Subjective: Patient seen and examined at bedside.  Denies chest pain or worsening shortness breath.  No overnight fever or vomiting. Objective: Vitals:   12/31/19 2300 01/01/20 0306 01/01/20 0514 01/01/20 0525  BP: 110/79 116/68 (!) 144/91   Pulse:  65  (!) 133  Resp: 18 20    Temp: 97.6 F (36.4 C) 98.4 F (36.9 C)    TempSrc: Oral Oral    SpO2: 95% 93%    Weight:  120.7 kg    Height:         Intake/Output Summary (Last 24 hours) at 01/01/2020 0758 Last data filed at 12/31/2019 2305 Gross per 24 hour  Intake 3 ml  Output --  Net 3 ml   Filed Weights   12/29/19 1619 01/01/20 0306  Weight: 127 kg 120.7 kg    Examination:  General exam: No acute distress.  Appears calm and comfortable.  Respiratory system: Bilateral decreased breath sounds at bases with bibasilar crackles.  No wheezing  cardiovascular system: S1 & S2 heard, currently rate controlled Gastrointestinal system: Abdomen is morbidly obese, nondistended, soft and nontender. Normal bowel sounds heard. Extremities: No cyanosis; trace lower extremity edema present   Data Reviewed: I have personally reviewed following labs and imaging studies  CBC: Recent Labs  Lab 12/29/19 1643 12/30/19 0639  WBC 9.6 8.6  HGB 13.9 13.7  HCT 43.7 43.4  MCV 93.2 93.5  PLT 271 336   Basic Metabolic Panel: Recent Labs  Lab 12/29/19 1643 12/30/19 0639 01/01/20 0303  NA 137 137 138  K 4.5 4.3 4.4  CL 105 102 103  CO2 23 22 23   GLUCOSE 105* 113* 102*  BUN 16 15 25*  CREATININE 0.90 0.83 1.08  CALCIUM 9.4 9.6 9.0  MG  --   --  2.0   GFR: Estimated Creatinine Clearance: 84.5 mL/min (by C-G formula based on SCr of 1.08 mg/dL). Liver Function Tests: Recent Labs  Lab 12/29/19 1643 12/30/19 0639  AST 36 37  ALT 37 38  ALKPHOS 51 52  BILITOT  0.9 1.1  PROT 7.1 7.0  ALBUMIN 4.1 3.9   No results for input(s): LIPASE, AMYLASE in the last 168 hours. No results for input(s): AMMONIA in the last 168 hours. Coagulation Profile: No results for input(s): INR, PROTIME in the last 168 hours. Cardiac Enzymes: No results for input(s): CKTOTAL, CKMB, CKMBINDEX, TROPONINI in the last 168 hours. BNP (last 3 results) No results for input(s): PROBNP in the last 8760 hours. HbA1C: No results for input(s): HGBA1C in the last 72 hours. CBG: No results for input(s): GLUCAP in the last 168 hours. Lipid Profile: No results  for input(s): CHOL, HDL, LDLCALC, TRIG, CHOLHDL, LDLDIRECT in the last 72 hours. Thyroid Function Tests: Recent Labs    12/30/19 0639  TSH 0.790   Anemia Panel: No results for input(s): VITAMINB12, FOLATE, FERRITIN, TIBC, IRON, RETICCTPCT in the last 72 hours. Sepsis Labs: No results for input(s): PROCALCITON, LATICACIDVEN in the last 168 hours.  Recent Results (from the past 240 hour(s))  Novel Coronavirus, NAA ([facility])     Status: None   Collection Time: 12/26/19 10:51 AM   Specimen: Nasopharyngeal(NP) swabs in vial transport medium   NASOPHARYNGE  TESTING  Result Value Ref Range Status   SARS-CoV-2, NAA Not Detected Not Detected Final    Comment: This nucleic acid amplification test was developed and its performance characteristics determined by [facility]. Nucleic acid amplification tests include PCR and TMA. This test has not been FDA cleared or approved. This test has been authorized by FDA under an Emergency Use Authorization (EUA). This test is only authorized for the duration of time the declaration that circumstances exist justifying the authorization of the emergency use of in vitro diagnostic tests for detection of SARS-CoV-2 virus and/or diagnosis of COVID-19 infection under section 564(b)(1) of the Act, 21 U.S.C. 161WRU-0(A360bbb-3(b) (1), unless the authorization is terminated or revoked sooner. When diagnostic testing is negative, the possibility of a false negative result should be considered in the context of a patient's recent exposures and the presence of clinical signs and symptoms consistent with COVID-19. An individual without symptoms of COVID-19 and who is not shedding SARS-CoV-2 virus would  expect to have a negative (not detected) result in this assay.   Respiratory Panel by RT PCR (Flu A&B, Covid) - Nasopharyngeal Swab     Status: None   Collection Time: 12/29/19  9:32 PM   Specimen: Nasopharyngeal Swab  Result Value Ref Range Status   SARS  Coronavirus 2 by RT PCR NEGATIVE NEGATIVE Final    Comment: (NOTE) SARS-CoV-2 target nucleic acids are NOT DETECTED. The SARS-CoV-2 RNA is generally detectable in upper respiratoy specimens during the acute phase of infection. The lowest concentration of SARS-CoV-2 viral copies this assay can detect is 131 copies/mL. A negative result does not preclude SARS-Cov-2 infection and should not be used as the sole basis for treatment or other patient management decisions. A negative result may occur with  improper specimen collection/handling, submission of specimen other than nasopharyngeal swab, presence of viral mutation(s) within the areas targeted by this assay, and inadequate number of viral copies (<131 copies/mL). A negative result must be combined with clinical observations, patient history, and epidemiological information. The expected result is Negative. Fact Sheet for Patients:  https://www.moore.com/https://www.fda.gov/media/142436/download Fact Sheet for Healthcare Providers:  https://www.young.biz/https://www.fda.gov/media/142435/download This test is not yet ap proved or cleared by the [place] FDA and  has been authorized for detection and/or diagnosis of SARS-CoV-2 by FDA under an Emergency Use Authorization (EUA). This EUA will remain  in effect (meaning this test can be used) for the duration of the COVID-19 declaration under Section 564(b)(1) of the Act, 21 U.S.C. section 360bbb-3(b)(1), unless the authorization is terminated or revoked sooner.    Influenza A by PCR NEGATIVE NEGATIVE Final   Influenza B by PCR NEGATIVE NEGATIVE Final    Comment: (NOTE) The Xpert Xpress SARS-CoV-2/FLU/RSV assay is intended as an aid in  the diagnosis of influenza from Nasopharyngeal swab specimens and  should not be used as a sole basis for treatment. Nasal washings and  aspirates are unacceptable for Xpert Xpress SARS-CoV-2/FLU/RSV  testing. Fact Sheet for Patients: https://www.moore.com/ Fact Sheet  for Healthcare Providers: https://www.young.biz/ This test is not yet approved or cleared by the [place] FDA and  has been authorized for detection and/or diagnosis of SARS-CoV-2 by  FDA under an Emergency Use Authorization (EUA). This EUA will remain  in effect (meaning this test can be used) for the duration of the  Covid-19 declaration under Section 564(b)(1) of the Act, 21  U.S.C. section 360bbb-3(b)(1), unless the authorization is  terminated or revoked. Performed at [facility], [address], [place], [place] [address]          Radiology Studies: ECHOCARDIOGRAM COMPLETE  Result Date: 12/30/2019   ECHOCARDIOGRAM REPORT   Patient Name:   Danny Mccormick Date of Exam: 12/30/2019 Medical Rec #:  [id number]        Height:       66.7 in Accession #:    [id number]       Weight:       280.0 lb Date of Birth:  05/15/1956       BSA:          2.33 m Patient Age:    63 years         BP:           113/87 mmHg Patient Gender: M                HR:           99 bpm. Exam Location:  Inpatient Procedure: 2D Echo, Color Doppler and Cardiac Doppler Indications:    I48.92* Unspecified atrial flutter  History:        Patient has no prior history of Echocardiogram examinations.                 Arrythmias:Atrial Flutter; Risk Factors:Hypertension.  Sonographer:    Irving Burton Senior RDCS Referring Phys: [id number] JAMES KIM IMPRESSIONS  1. Left ventricular ejection fraction, by visual estimation, is 30 to 35%. The left ventricle has severely decreased function. There is mildly increased left ventricular hypertrophy.  2. Left ventricular diastolic function could not be evaluated.  3. The left ventricle demonstrates global hypokinesis.  4. Global right ventricle has mildly reduced systolic function.The right ventricular size is normal. No increase in right ventricular wall thickness.  5. Left atrial size was moderately dilated.  6. Right atrial size was moderately dilated.  7. The mitral valve is  abnormal. Mild mitral valve regurgitation.  8. The tricuspid valve is grossly normal.  9. The aortic valve was not well visualized. Aortic valve regurgitation is not visualized. 10. The pulmonic valve was grossly normal. Pulmonic valve regurgitation is trivial. 11. Moderately elevated pulmonary artery systolic pressure. 12. The inferior vena cava is dilated in size with <50% respiratory variability, suggesting right atrial pressure of 15 mmHg. FINDINGS  Left Ventricle: Left ventricular ejection fraction, by visual estimation, is 30 to 35%. The  left ventricle has severely decreased function. The left ventricle demonstrates global hypokinesis. There is mildly increased left ventricular hypertrophy. The left ventricular diastology could not be evaluated due to atrial fibrillation. Left ventricular diastolic function could not be evaluated. Right Ventricle: The right ventricular size is normal. No increase in right ventricular wall thickness. Global RV systolic function is has mildly reduced systolic function. The tricuspid regurgitant velocity is 2.96 m/s, and with an assumed right atrial pressure of 15 mmHg, the estimated right ventricular systolic pressure is moderately elevated at 49.9 mmHg. Left Atrium: Left atrial size was moderately dilated. Right Atrium: Right atrial size was moderately dilated Pericardium: There is no evidence of pericardial effusion. Mitral Valve: The mitral valve is abnormal. There is mild thickening of the mitral valve leaflet(s). Mild mitral valve regurgitation. Tricuspid Valve: The tricuspid valve is grossly normal. Tricuspid valve regurgitation is mild. Aortic Valve: The aortic valve was not well visualized. Aortic valve regurgitation is not visualized. Pulmonic Valve: The pulmonic valve was grossly normal. Pulmonic valve regurgitation is trivial. Pulmonic regurgitation is trivial. Aorta: The aortic root and ascending aorta are structurally normal, with no evidence of dilitation. Venous:  The inferior vena cava is dilated in size with less than 50% respiratory variability, suggesting right atrial pressure of 15 mmHg. IAS/Shunts: No atrial level shunt detected by color flow Doppler.  LEFT VENTRICLE PLAX 2D LVIDd:         4.40 cm LVIDs:         4.20 cm LV PW:         1.40 cm LV IVS:        1.00 cm LVOT diam:     2.20 cm LV SV:         9 ml LV SV Index:   3.63 LVOT Area:     3.80 cm  LV Volumes (MOD) LV area d, A2C:    36.90 cm LV area d, A4C:    46.30 cm LV area s, A2C:    29.00 cm LV area s, A4C:    34.40 cm LV major d, A2C:   9.42 cm LV major d, A4C:   9.58 cm LV major s, A2C:   8.50 cm LV major s, A4C:   8.52 cm LV vol d, MOD A2C: 123.0 ml LV vol d, MOD A4C: 183.0 ml LV vol s, MOD A2C: 85.0 ml LV vol s, MOD A4C: 117.0 ml LV SV MOD A2C:     38.0 ml LV SV MOD A4C:     183.0 ml LV SV MOD BP:      50.9 ml RIGHT VENTRICLE TAPSE (M-mode): 1.2 cm LEFT ATRIUM              Index       RIGHT ATRIUM           Index LA diam:        5.00 cm  2.15 cm/m  RA Area:     28.50 cm LA Vol (A2C):   105.0 ml 45.14 ml/m RA Volume:   101.00 ml 43.42 ml/m LA Vol (A4C):   95.2 ml  40.93 ml/m LA Biplane Vol: 103.0 ml 44.28 ml/m  AORTIC VALVE LVOT Vmax:   49.00 cm/s LVOT Vmean:  37.400 cm/s LVOT VTI:    0.079 m  AORTA Ao Root diam: 3.50 cm Ao Asc diam:  3.30 cm TRICUSPID VALVE TR Peak grad:   34.9 mmHg TR Vmax:        309.00 cm/s  SHUNTS Systemic  VTI:  0.08 m Systemic Diam: 2.20 cm  Lyman Bishop MD Electronically signed by Lyman Bishop MD Signature Date/Time: 12/30/2019/2:58:35 PM    Final         Scheduled Meds:  apixaban  5 mg Oral BID   metoprolol tartrate  75 mg Oral BID   sodium chloride flush  3 mL Intravenous Q12H   Continuous Infusions:  sodium chloride     diltiazem (CARDIZEM) infusion 15 mg/hr (12/31/19 3794)          Aline August, MD [facility] 01/01/2020, 7:58 AM

## 2020-01-01 NOTE — Discharge Instructions (Addendum)
To check your heart rhythm, you can get ALIVECOR, KARDIAMOBILE device. This talks to your smart phone and will give you both a heart rate and a tracing. It is approx $100 online, may be available at a pharmcy.   WEIGH DAILY, every am, wearing the same amount of clothing Record weights, contact Mihai Croitoru, MD for weight gain of 3 lbs in a day or 5 lbs in a week Limit sodium to 500 mg per meal, total 2000 mg per day Limit all liquids to 1.5-2 liters/quarts per day   Information on my medicine - ELIQUIS (apixaban)  This medication education was reviewed with me or my healthcare representative as part of my discharge preparation.  Why was Eliquis prescribed for you? Eliquis was prescribed for you to reduce the risk of a blood clot forming that can cause a stroke if you have a medical condition called atrial fibrillation (a type of irregular heartbeat).  What do You need to know about Eliquis ? Take your Eliquis TWICE DAILY - one tablet in the morning and one tablet in the evening with or without food. If you have difficulty swallowing the tablet whole please discuss with your pharmacist how to take the medication safely.  Take Eliquis exactly as prescribed by your doctor and DO NOT stop taking Eliquis without talking to the doctor who prescribed the medication.  Stopping may increase your risk of developing a stroke.  Refill your prescription before you run out.  After discharge, you should have regular check-up appointments with your healthcare provider that is prescribing your Eliquis.  In the future your dose may need to be changed if your kidney function or weight changes by a significant amount or as you get older.  What do you do if you miss a dose? If you miss a dose, take it as soon as you remember on the same day and resume taking twice daily.  Do not take more than one dose of ELIQUIS at the same time to make up a missed dose.  Important Safety Information A possible side  effect of Eliquis is bleeding. You should call your healthcare provider right away if you experience any of the following: ? Bleeding from an injury or your nose that does not stop. ? Unusual colored urine (red or dark brown) or unusual colored stools (red or black). ? Unusual bruising for unknown reasons. ? A serious fall or if you hit your head (even if there is no bleeding).  Some medicines may interact with Eliquis and might increase your risk of bleeding or clotting while on Eliquis. To help avoid this, consult your healthcare provider or pharmacist prior to using any new prescription or non-prescription medications, including herbals, vitamins, non-steroidal anti-inflammatory drugs (NSAIDs) and supplements.  This website has more information on Eliquis (apixaban): http://www.eliquis.com/eliquis/home

## 2020-01-01 NOTE — Progress Notes (Signed)
  Echocardiogram 2D Echocardiogram has been performed.  Danny Mccormick 01/01/2020, 11:53 AM

## 2020-01-02 ENCOUNTER — Encounter: Payer: Self-pay | Admitting: Cardiovascular Disease

## 2020-01-02 ENCOUNTER — Telehealth: Payer: Self-pay | Admitting: *Deleted

## 2020-01-02 MED ORDER — METOPROLOL TARTRATE 100 MG PO TABS: 100.0000 mg | ORAL_TABLET | Freq: Two times a day (BID) | ORAL | 1 refills | Status: DC

## 2020-01-02 NOTE — Telephone Encounter (Signed)
Received call from patients niece who is currently with patient.  States EMS is currently at patients house and she would like to know if he should go to the hospital.   She states he started having intermittent chest pain 1 hour ago radiating into his neck, took ASA and pain resolved.     States pain comes back "slightly" when he stands up or moves around.   Denies SOB, dizziness, lightheadedness, blurred vision.   EMS states BP 156/95, HR 90-105 and EKG shows atrial fib.    Patient is symptom free currently.      Per chart review: Patient d/c from hospital yesterday.  TEE/DCCV completed for new aflutter, successfully converted to NSR.    Patient is currently taking metoprolol 75 mg BID and Eliquis BID (niece confirmed).      Reviewed with DOD Dr. Tresa Endo.   Recommended increasing metoprolol to 100 mg BID and schedule for OV tomorrow.  Niece made aware of recommendations, rx sent to pharmacy and appt scheduled for tomorrow with PA.   Advised if symptoms return or worsen, proceed to ER to be evaluated and treated.   She agreed with plan and verbalized understanding.

## 2020-01-02 NOTE — Telephone Encounter (Signed)
ERROR

## 2020-01-03 ENCOUNTER — Other Ambulatory Visit: Payer: Self-pay

## 2020-01-03 ENCOUNTER — Encounter: Payer: Self-pay | Admitting: Medical

## 2020-01-03 ENCOUNTER — Ambulatory Visit (INDEPENDENT_AMBULATORY_CARE_PROVIDER_SITE_OTHER): Payer: Medicare Other | Admitting: Medical

## 2020-01-03 VITALS — BP 137/90 | HR 131 | Temp 97.9°F | Ht 66.75 in | Wt 261.0 lb

## 2020-01-03 DIAGNOSIS — I483 Typical atrial flutter: Secondary | ICD-10-CM | POA: Diagnosis not present

## 2020-01-03 DIAGNOSIS — I2510 Atherosclerotic heart disease of native coronary artery without angina pectoris: Secondary | ICD-10-CM

## 2020-01-03 DIAGNOSIS — R072 Precordial pain: Secondary | ICD-10-CM

## 2020-01-03 DIAGNOSIS — I5041 Acute combined systolic (congestive) and diastolic (congestive) heart failure: Secondary | ICD-10-CM | POA: Diagnosis not present

## 2020-01-03 DIAGNOSIS — R9431 Abnormal electrocardiogram [ECG] [EKG]: Secondary | ICD-10-CM

## 2020-01-03 DIAGNOSIS — R931 Abnormal findings on diagnostic imaging of heart and coronary circulation: Secondary | ICD-10-CM

## 2020-01-03 DIAGNOSIS — R079 Chest pain, unspecified: Secondary | ICD-10-CM

## 2020-01-03 DIAGNOSIS — I4819 Other persistent atrial fibrillation: Secondary | ICD-10-CM

## 2020-01-03 DIAGNOSIS — I10 Essential (primary) hypertension: Secondary | ICD-10-CM

## 2020-01-03 MED ORDER — AMIODARONE HCL 200 MG PO TABS: 400.0000 mg | ORAL_TABLET | Freq: Two times a day (BID) | ORAL | 0 refills | Status: DC

## 2020-01-03 MED ORDER — ENTRESTO 24-26 MG PO TABS: 1.0000 | ORAL_TABLET | Freq: Two times a day (BID) | ORAL | 0 refills | Status: AC

## 2020-01-03 MED ORDER — NITROGLYCERIN 0.4 MG SL SUBL: 0.4000 mg | SUBLINGUAL_TABLET | SUBLINGUAL | 3 refills | Status: DC | PRN

## 2020-01-03 MED ORDER — METOPROLOL SUCCINATE ER 100 MG PO TB24: 100.0000 mg | ORAL_TABLET | Freq: Every day | ORAL | 0 refills | Status: DC

## 2020-01-03 NOTE — Progress Notes (Signed)
Cardiology Office Note   Date:  01/03/2020   ID:  Ibn, Stief 01/22/1956, MRN [id number]  PCP:  Asencion Noble, MD  Cardiologist:  Sanda Klein, MD EP: Will Meredith Leeds, MD  Chief Complaint  Patient presents with  . Hospitalization Follow-up    atrial fibrillation      History of Present Illness: Danny Mccormick is a 64 y.o. male with a PMH of HTN, psoriasis, and recently diagnosed atrial flutter and acute combined CHF (EF 20-25% - felt to be tachycardia mediated) who presents for follow-up of his atrial flutter.  He was admitted to the hospital 12/29/19-01/01/20 after presenting with DOE and was found to be in new onset atrial flutter with RVR. He had a CTA Chest which was negative for PE, however showed coronary artery calcifications and aortic atherosclerotic calcifications. He underwent an echo which showed EF 30-35%, mild LVH, global hypokinesis, mild MR, and moderate biatrial enlargement. Atrial flutter persisted with difficulty achieving rate control, therefore patient underwent TEE which showed further decline in EF to 20-25%, small PFO with right to left shunting across the atrial septum with inspiration, moderate LAE without thrombus, severe RAE, and mild MR. He underwent successful DCCV 01/01/20 and was discharged home later that day on eliquis 5mg  BID for stroke ppx, metoprolol tartrate 75mg  BID for rate control, and lasix 40mg  daily for volume management.   Unfortunately on 01/02/20 he called the office to report chest pain radiating to his neck which resolved after taking aspirin. EMS was activated and patient was found to be back in atrial fibrillation on EKG with HR in the 90s-100s. His situation was reviewed with the DOD, Dr. Claiborne Billings, and decision made to increase metoprolol to 100mg  BID and close outpatient follow-up, for which this appointment was scheduled.   He presents today for follow-up. He tells me the chest pain started while he was laying down yesterday.  He reports upper left sided chest pain which was a sharp stabbing pain, worse with deep breathing, though no other associated symptoms. Once EMS arrived he took aspirin and reported belching a couple times and symptoms resolved. He attributes the chest pain was related to indigestion. No recurrence since that time. He reports breathing has been good since discharge and weight has continued to decrease. No complaints of LE edema, dizziness, lightheadedness, or syncope.    Past Medical History:  Diagnosis Date  . Arthritis   . Atrial fibrillation with RVR ([place]) 01/01/2020  . Eczema   . Hypertension     Past Surgical History:  Procedure Laterality Date  . BUBBLE STUDY  01/01/2020   Procedure: BUBBLE STUDY;  Surgeon: Elouise Munroe, MD;  Location: [place];  Service: Cardiovascular;;  . CARDIOVERSION N/A 01/01/2020   Procedure: CARDIOVERSION;  Surgeon: Elouise Munroe, MD;  Location: [facility] ENDOSCOPY;  Service: Cardiovascular;  Laterality: N/A;  . HAND SURGERY    . KNEE SURGERY    . TEE WITHOUT CARDIOVERSION N/A 01/01/2020   Procedure: TRANSESOPHAGEAL ECHOCARDIOGRAM (TEE);  Surgeon: Elouise Munroe, MD;  Location: [facility] ENDOSCOPY;  Service: Cardiovascular;  Laterality: N/A;     Current Outpatient Medications  Medication Sig Dispense Refill  . apixaban (ELIQUIS) 5 MG TABS tablet Take 1 tablet (5 mg total) by mouth 2 (two) times daily. 60 tablet 0  . Apremilast (OTEZLA) 30 MG TABS Take 1 tablet by mouth 2 (two) times daily.    . furosemide (LASIX) 40 MG tablet Take 1 tablet (40 mg total) by mouth  daily. 30 tablet 0  . amiodarone (PACERONE) 200 MG tablet Take 2 tablets (400 mg total) by mouth 2 (two) times daily. 120 tablet 0  . metoprolol succinate (TOPROL-XL) 100 MG 24 hr tablet Take 1 tablet (100 mg total) by mouth daily. Take with or immediately following a meal. 90 tablet 0  . nitroGLYCERIN (NITROSTAT) 0.4 MG SL tablet Place 1 tablet (0.4 mg total) under the tongue every 5 (five)  minutes as needed for chest pain. 90 tablet 3  . sacubitril-valsartan (ENTRESTO) 24-26 MG Take 1 tablet by mouth 2 (two) times daily. 60 tablet 0   No current facility-administered medications for this visit.    Allergies:   Patient has no known allergies.    Social History:  The patient  reports that he has quit smoking. He has never used smokeless tobacco. He reports current alcohol use. He reports that he does not use drugs.   Family History:  The patient's family history includes Atrial fibrillation in his brother.    ROS:  Please see the history of present illness.   Otherwise, review of systems are positive for none.   All other systems are reviewed and negative.    PHYSICAL EXAM: VS:  BP 137/90   Pulse (!) 131   Temp 97.9 F (36.6 C)   Ht 5' 6.75" (1.695 m)   Wt 261 lb (118.4 kg)   SpO2 96%   BMI 41.19 kg/m  , BMI Body mass index is 41.19 kg/m. GEN: Well nourished, well developed, in no acute distress HEENT: normal Neck: no JVD, carotid bruits, or masses Cardiac: IRIR; no murmurs, rubs, or gallops, no edema  Respiratory:  clear to auscultation bilaterally, normal work of breathing GI: soft, obese, nontender, nondistended, + BS MS: no deformity or atrophy Skin: warm and dry, no rash Neuro:  Strength and sensation are intact Psych: euthymic mood, full affect   EKG:  EKG is ordered today. The ekg ordered today demonstrates atrial fibrillation with RVR, rate 131 bpm, no STE/D, T wave abnormalities in inferolateral leads seen on previous.    Recent Labs: 12/29/2019: B Natriuretic Peptide 34.0 12/30/2019: ALT 38; Hemoglobin 13.7; Platelets 336; TSH 0.790 01/01/2020: BUN 25; Creatinine, Ser 1.08; Magnesium 2.0; Potassium 4.4; Sodium 138    Lipid Panel No results found for: CHOL, TRIG, HDL, CHOLHDL, VLDL, LDLCALC, LDLDIRECT    Wt Readings from Last 3 Encounters:  01/03/20 261 lb (118.4 kg)  01/01/20 266 lb (120.7 kg)      Other studies Reviewed: Additional  studies/ records that were reviewed today include:   TTE 12/29/18 1. Left ventricular ejection fraction, by visual estimation, is 30 to 35%. The left ventricle has severely decreased function. There is mildly increased left ventricular hypertrophy. 2. Left ventricular diastolic function could not be evaluated. 3. The left ventricle demonstrates global hypokinesis. 4. Global right ventricle has mildly reduced systolic function.The right ventricular size is normal. No increase in right ventricular wall thickness. 5. Left atrial size was moderately dilated. 6. Right atrial size was moderately dilated. 7. The mitral valve is abnormal. Mild mitral valve regurgitation. 8. The tricuspid valve is grossly normal. 9. The aortic valve was not well visualized. Aortic valve regurgitation is not visualized. 10. The pulmonic valve was grossly normal. Pulmonic valve regurgitation is trivial. 11. Moderately elevated pulmonary artery systolic pressure. 12. The inferior vena cava is dilated in size with <50% respiratory variability, suggesting right atrial pressure of 15 mmHg.  TEE 01/01/20:  1. Left ventricular ejection fraction,  by visual estimation, is 20 to 25%. The left ventricle has severely decreased function. There is mildly increased left ventricular hypertrophy. No left ventricular apical thrombus seen.  2. The left ventricle demonstrates global hypokinesis.  3. Global right ventricle has moderately reduced systolic function.The right ventricular size is normal. No increase in right ventricular wall thickness.  4. Left atrial size was moderately dilated. No LA or LAA thrombus seen.  5. Right atrial size was severely dilated.  6. The mitral valve is normal in structure. Mild mitral valve regurgitation.  7. Small patent foramen ovale with predominantly right to left shunting across the atrial septum with inspiration.  8. Evidence of atrial level shunting detected by color flow Doppler.  9. There  is mild dilatation at the level of the sinuses of Valsalva measuring 42 mm. 10. The aortic valve is tricuspid. Aortic valve regurgitation is not visualized. 11. The tricuspid valve is normal in structure. 12. The pulmonic valve was normal in structure. Pulmonic valve regurgitation is trivial. 13. Trivial pericardial effusion is present.   ASSESSMENT AND PLAN:  1. Persistent atrial flutter/fibrillation: EKG today with atrial fibrillation with RVR, rate 131. S/p TEE/DCCV 01/01/20. His is unaware of his atrial fibrillation. Thankfully no recurrent SOB, though with his EF of 20-25% he is likely to not tolerate his tachycardia very long.  - Will start amiodarone 400mg  BID until follow-up next week - Will transition from metoprolol tartrate to metoprolol succinate 100mg  daily for rate control - Continue eliquis 5mg  BID for stroke ppx - We discussed having a VERY low threshold to present to the ED, in which case he could undergo DCCV as long as he has not missed any doses of eliquis as recent TEE was without thrombus.  - If patient is persistently in atrial fibrillation/flutter at follow-up visit with 01/09/20, will likely need to be scheduled for DCCV at that time.   2. Acute combined CHF: EF 30-35% on echo 12/30/19 with further decline in EF to 20-25% on TEE 01/01/20. He was discharged home on metoprolol tartrate for rate control and CHF. ACEi/ARB/ARNI was not initiated at that time. Weight is down 4lbs from discharge.  - Will check BMET today - Will transition to metoprolol succinate 100mg  daily - Will start low dose entresto at this time. He will need a repeat BMET at his follow-up visit.   3. Chest pain in patient with coronary artery calcifications on CT scan: no prior ischemic evaluation. Now with new atrial flutter/fib and combined CHF with EF 20-25%. He would benefit from an ischemic evaluation, however invasive testing limited at this time due to recent DCCV and need for uninterrupted  anticoagulation x1 month. He had a brief episode of atypical chest pain yesterday which occurred at rest and resolved with aspirin and belching.  - Will plan for a coronary CTA to evaluate coronary artery anatomy - will need to ensure he is in NSR at the time of his exam. Will order the test now as scheduling is a few weeks out. - Will give Rx for SL nitro - administration instructions reviewed.  4. HTN: BP 137/90 today - Managed in the context of #1 and 2.    Current medicines are reviewed at length with the patient today.  The patient does not have concerns regarding medicines.  The following changes have been made:  As above  Labs/ tests ordered today include:   Orders Placed This Encounter  Procedures  . CT CORONARY FRACTIONAL FLOW RESERVE DATA PREP  .  CT CORONARY MORPH W/CTA COR W/SCORE W/CA W/CM &/OR WO/CM  . CT CORONARY FRACTIONAL FLOW RESERVE FLUID ANALYSIS  . Basic metabolic panel  . EKG 12-Lead     Disposition:   FU with Bettina Gavia, PA-C 01/09/20 as scheduled  Signed, Beatriz Stallion, PA-C  01/03/2020 4:04 PM

## 2020-01-03 NOTE — Patient Instructions (Addendum)
Medication Instructions:   STOP METOPROLOL TARTRATE   START METOPROLOL SUCCINATE 100 MG DAILY  START AMIODARONE 400 MG 2 TIMES A DAY UNTIL YOUR FOLLOW UP APPOINTMENT NEXT WEEK   USE SUBLINGUAL NITROGLYCERIN  AS NEEDED FOR CHEST PAIN. TAKE 1 TABLET EVERY 5 MINUTES AFTER 5 MINUTES IS UP AND STILL HAVING CHEST PAIN TAKE ANOTHER TABLET AND CALL 911. DO NOT USE MORE THAN 3 TABLETS IN AN EPISODE OF CHEST PAIN   *If you need a refill on your cardiac medications before your next appointment, please call your pharmacy*  Lab Work: Your physician recommends that you return for lab work TODAY:  BMET If you have labs (blood work) drawn today and your tests are completely normal, you will receive your results only by:  MyChart Message (if you have MyChart) OR  A paper copy in the mail If you have any lab test that is abnormal or we need to change your treatment, we will call you to review the results.  Testing/Procedures: Non-Cardiac CT Angiography (CTA), is a special type of CT scan that uses a computer to produce multi-dimensional views of major blood vessels throughout the body. In CT angiography, a contrast material is injected through an IV to help visualize the blood vessels   Follow-Up: At [facility], you and your health needs are our priority.  As part of our continuing mission to provide you with exceptional heart care, we have created designated Provider Care Teams.  These Care Teams include your primary Cardiologist (physician) and Advanced Practice Providers (APPs -  Physician Assistants and Nurse Practitioners) who all work together to provide you with the care you need, when you need it.  Your next appointment:   FOLLOW UP AS SCHEDULED   The format for your next appointment:   In Person with  Provider:   Micah Flesher, PA-C  Other Instructions Your cardiac CT will be scheduled at one of the below locations:   [facility] [address] [place], [place]  [address] [phone]  OR  [facility] [address] [place], [place] [address] [phone]  If scheduled at [facility], please arrive at the [facility] main entrance of [facility] 30-45 minutes prior to test start time. Proceed to the [facility] Radiology Department (first floor) to check-in and test prep.  If scheduled at [facility], please arrive 15 mins early for check-in and test prep.  Please follow these instructions carefully (unless otherwise directed):  Hold all erectile dysfunction medications at least 3 days (72 hrs) prior to test.  On the Night Before the Test:  Be sure to Drink plenty of water.  Do not consume any caffeinated/decaffeinated beverages or chocolate 12 hours prior to your test.  Do not take any antihistamines 12 hours prior to your test.  If the patient has contrast allergy: ? Patient will need a prescription for Prednisone and very clear instructions (as follows): 1. Prednisone 50 mg - take 13 hours prior to test 2. Take another Prednisone 50 mg 7 hours prior to test 3. Take another Prednisone 50 mg 1 hour prior to test 4. Take Benadryl 50 mg 1 hour prior to test  Patient must complete all four doses of above prophylactic medications.  Patient will need a ride after test due to Benadryl.  On the Day of the Test:  Drink plenty of water. Do not drink any water within one hour of the test.  Do not eat any food 4 hours prior to the test.  You may take your regular medications prior to the test.   Take metoprolol (Lopressor) two hours prior to test.  HOLD Furosemide/Hydrochlorothiazide morning of the test.  FEMALES- please wear underwire-free bra if available   *For Clinical Staff only. Please instruct patient the following:*        -Drink plenty of water       -Hold Furosemide/hydrochlorothiazide morning of the test       -Take metoprolol  (Lopressor) 2 hours prior to test (if applicable).                  -If HR is less than 55 BPM- No Beta Blocker                -IF HR is greater than 55 BPM and patient is less than or equal to 41 yrs old Lopressor 100mg  x1.                -If HR is greater than 55 BPM and patient is greater than 7 yrs old Lopressor 50 mg x1.     Do not give Lopressor to patients with an allergy to lopressor or anyone with asthma or active COPD symptoms (currently taking steroids).       After the Test:  Drink plenty of water.  After receiving IV contrast, you may experience a mild flushed feeling. This is normal.  On occasion, you may experience a mild rash up to 24 hours after the test. This is not dangerous. If this occurs, you can take Benadryl 25 mg and increase your fluid intake.  If you experience trouble breathing, this can be serious. If it is severe call 911 IMMEDIATELY. If it is mild, please call our office.  If you take any of these medications: Glipizide/Metformin, Avandament, Glucavance, please do not take 48 hours after completing test unless otherwise instructed.   Once we have confirmed authorization from your insurance company, we will call you to set up a date and time for your test.   For non-scheduling related questions, please contact the cardiac imaging nurse navigator should you have any questions/concerns: Marchia Bond, RN Navigator Cardiac Imaging [facility] [phone] Office      Low-Sodium Eating Plan Sodium, which is an element that makes up salt, helps you maintain a healthy balance of fluids in your body. Too much sodium can increase your blood pressure and cause fluid and waste to be held in your body. Your health care provider or dietitian may recommend following this plan if you have high blood pressure (hypertension), kidney disease, liver disease, or heart failure. Eating less sodium can help lower your blood pressure, reduce  swelling, and protect your heart, liver, and kidneys. What are tips for following this plan? General guidelines  Most people on this plan should limit their sodium intake to 1,500-2,000 mg (milligrams) of sodium each day. Reading food labels   The Nutrition Facts label lists the amount of sodium in one serving of the food. If you eat more than one serving, you must multiply the listed amount of sodium by the number of servings.  Choose foods with less than 140 mg of sodium per serving.  Avoid foods with 300 mg of sodium or more per serving. Shopping  Look for lower-sodium products, often labeled as "low-sodium" or "no salt added."  Always check the sodium content even if foods are labeled as "unsalted" or "  no salt added".  Buy fresh foods. ? Avoid canned foods and premade or frozen meals. ? Avoid canned, cured, or processed meats  Buy breads that have less than 80 mg of sodium per slice. Cooking  Eat more home-cooked food and less restaurant, buffet, and fast food.  Avoid adding salt when cooking. Use salt-free seasonings or herbs instead of table salt or sea salt. Check with your health care provider or pharmacist before using salt substitutes.  Cook with plant-based oils, such as canola, sunflower, or olive oil. Meal planning  When eating at a restaurant, ask that your food be prepared with less salt or no salt, if possible.  Avoid foods that contain MSG (monosodium glutamate). MSG is sometimes added to [place] food, bouillon, and some canned foods. What foods are recommended? The items listed may not be a complete list. Talk with your dietitian about what dietary choices are best for you. Grains Low-sodium cereals, including oats, puffed wheat and rice, and shredded wheat. Low-sodium crackers. Unsalted rice. Unsalted pasta. Low-sodium bread. Whole-grain breads and whole-grain pasta. Vegetables Fresh or frozen vegetables. "No salt added" canned vegetables. "No salt added"  tomato sauce and paste. Low-sodium or reduced-sodium tomato and vegetable juice. Fruits Fresh, frozen, or canned fruit. Fruit juice. Meats and other protein foods Fresh or frozen (no salt added) meat, poultry, seafood, and fish. Low-sodium canned tuna and salmon. Unsalted nuts. Dried peas, beans, and lentils without added salt. Unsalted canned beans. Eggs. Unsalted nut butters. Dairy Milk. Soy milk. Cheese that is naturally low in sodium, such as ricotta cheese, fresh mozzarella, or Swiss cheese Low-sodium or reduced-sodium cheese. Cream cheese. Yogurt. Fats and oils Unsalted butter. Unsalted margarine with no trans fat. Vegetable oils such as canola or olive oils. Seasonings and other foods Fresh and dried herbs and spices. Salt-free seasonings. Low-sodium mustard and ketchup. Sodium-free salad dressing. Sodium-free light mayonnaise. Fresh or refrigerated horseradish. Lemon juice. Vinegar. Homemade, reduced-sodium, or low-sodium soups. Unsalted popcorn and pretzels. Low-salt or salt-free chips. What foods are not recommended? The items listed may not be a complete list. Talk with your dietitian about what dietary choices are best for you. Grains Instant hot cereals. Bread stuffing, pancake, and biscuit mixes. Croutons. Seasoned rice or pasta mixes. Noodle soup cups. Boxed or frozen macaroni and cheese. Regular salted crackers. Self-rising flour. Vegetables Sauerkraut, pickled vegetables, and relishes. Olives. [place] fries. Onion rings. Regular canned vegetables (not low-sodium or reduced-sodium). Regular canned tomato sauce and paste (not low-sodium or reduced-sodium). Regular tomato and vegetable juice (not low-sodium or reduced-sodium). Frozen vegetables in sauces. Meats and other protein foods Meat or fish that is salted, canned, smoked, spiced, or pickled. Bacon, ham, sausage, hotdogs, corned beef, chipped beef, packaged lunch meats, salt pork, jerky, pickled herring, anchovies, regular canned  tuna, sardines, salted nuts. Dairy Processed cheese and cheese spreads. Cheese curds. Blue cheese. Feta cheese. String cheese. Regular cottage cheese. Buttermilk. Canned milk. Fats and oils Salted butter. Regular margarine. Ghee. Bacon fat. Seasonings and other foods Onion salt, garlic salt, seasoned salt, table salt, and sea salt. Canned and packaged gravies. Worcestershire sauce. Tartar sauce. Barbecue sauce. Teriyaki sauce. Soy sauce, including reduced-sodium. Steak sauce. Fish sauce. Oyster sauce. Cocktail sauce. Horseradish that you find on the shelf. Regular ketchup and mustard. Meat flavorings and tenderizers. Bouillon cubes. Hot sauce and Tabasco sauce. Premade or packaged marinades. Premade or packaged taco seasonings. Relishes. Regular salad dressings. Salsa. Potato and tortilla chips. Corn chips and puffs. Salted popcorn and pretzels. Canned or dried soups.  Pizza. Frozen entrees and pot pies. Summary  Eating less sodium can help lower your blood pressure, reduce swelling, and protect your heart, liver, and kidneys.  Most people on this plan should limit their sodium intake to 1,500-2,000 mg (milligrams) of sodium each day.  Canned, boxed, and frozen foods are high in sodium. Restaurant foods, fast foods, and pizza are also very high in sodium. You also get sodium by adding salt to food.  Try to cook at home, eat more fresh fruits and vegetables, and eat less fast food, canned, processed, or prepared foods. This information is not intended to replace advice given to you by your health care provider. Make sure you discuss any questions you have with your health care provider. Document Revised: 11/12/2017 Document Reviewed: 11/23/2016 Elsevier Patient Education  2020 [employer].

## 2020-01-03 NOTE — Progress Notes (Signed)
I agree. If possible, have on amio for 2 weeks before DCCV to help keep in NSR after shock. If he has CHF exacerbation, admit and shock sooner.

## 2020-01-04 ENCOUNTER — Other Ambulatory Visit: Payer: Self-pay

## 2020-01-04 DIAGNOSIS — Z79899 Other long term (current) drug therapy: Secondary | ICD-10-CM

## 2020-01-04 LAB — BASIC METABOLIC PANEL
BUN/Creatinine Ratio: 19 (ref 10–24)
BUN: 20 mg/dL (ref 8–27)
CO2: 23 mmol/L (ref 20–29)
Calcium: 9.6 mg/dL (ref 8.6–10.2)
Chloride: 98 mmol/L (ref 96–106)
Creatinine, Ser: 1.08 mg/dL (ref 0.76–1.27)
GFR calc Af Amer: 83 mL/min/{1.73_m2} (ref >59)
GFR calc non Af Amer: 72 mL/min/{1.73_m2} (ref >59)
Glucose: 89 mg/dL (ref 65–99)
Potassium: 4.9 mmol/L (ref 3.5–5.2)
Sodium: 138 mmol/L (ref 134–144)

## 2020-01-08 ENCOUNTER — Telehealth: Payer: Self-pay | Admitting: Physician Assistant

## 2020-01-08 NOTE — Telephone Encounter (Signed)
Spoke to pt and informed that Agie's schedule is closing for 1/26. Pt was agreeable to reschedule appointment for 1/28 at 3:00 PM.

## 2020-01-09 ENCOUNTER — Ambulatory Visit: Payer: PRIVATE HEALTH INSURANCE | Admitting: Physician Assistant

## 2020-01-10 NOTE — Progress Notes (Signed)
Cardiology Office Note:    Date:  01/11/2020   ID:  Danny Mccormick, DOB 1955-12-30, MRN [id number]  PCP:  Carylon Perches, MD  Cardiologist:  Thurmon Fair, MD   Referring MD: Carylon Perches, MD   Chief Complaint  Patient presents with  . Follow-up    persistent atrial fibrillation    History of Present Illness:    Danny Mccormick is a 64 y.o. male with a hx of hypertension, new diagnosis of atrial flutter and acute combined systolic and diastolic heart failure.  EF 20 to 25%-felt to be tachycardia mediated.  He was admitted to the hospital 12/29/2018 - 01/01/20 with dyspnea on exertion found to be in new onset atrial flutter with RVR.  CTA chest negative for PE but showed coronary artery calcifications and aortic atherosclerosis.  Echocardiogram showed EF of 30 to 35%, mild LVH, global hypokinesis, mild MR, and moderate biatrial enlargement.  Rate control was difficult and the patient ultimately underwent TEE guided DCCV which showed a further decline of EF to 20 to 25%.  TEE also showed a small PFO with right to left shunting across the atrial septum with inspiration, moderate LAE without thrombus, severe RA E, and mild MR.  Cardioversion was successful on 01/01/2020 and he was discharged home later that day on Eliquis 5 mg twice daily, metoprolol 75 mg twice daily, and 40 mg Lasix daily.  Unfortunately, on 01/02/2020 EMS was activated for chest pain resolved with aspirin.  Patient was found to be back in atrial fibrillation with heart rates in the 90s to 100s.  Metoprolol was increased to 100 mg twice daily with close outpatient follow-up.  He was seen in clinic by Judy Pimple, PA-C on 01/03/2020.  He will likely not tolerate further episodes of RVR given his propensity for heart failure exacerbation.  She started amiodarone 400 mg twice daily and transitioned his Lopressor to metoprolol succinate 100 mg daily and close follow-up.  He presents today for follow up. BP log is well-controlled. Weight  is down to 259-260 lbs. HR 60-90, one HR at 102 bpm.  No chest pain, SOB, DOE, lower extremity swelling, or orthopnea. He is counting mg of sodium, weighing daily, and keeping diligent notes on his vitals every day. I congratulated him on his hard work. We discussed continuing amiodarone 400 mg BID with DCCV on Feb 5 and close follow up with EP on Feb 8. He will reduce his amiodarone to 200 mg daily after cardioversion.    Past Medical History:  Diagnosis Date  . Arthritis   . Atrial fibrillation with RVR (HCC) 01/01/2020  . Eczema   . Hypertension     Past Surgical History:  Procedure Laterality Date  . BUBBLE STUDY  01/01/2020   Procedure: BUBBLE STUDY;  Surgeon: Parke Poisson, MD;  Location: [facility] ENDOSCOPY;  Service: Cardiovascular;;  . CARDIOVERSION N/A 01/01/2020   Procedure: CARDIOVERSION;  Surgeon: Parke Poisson, MD;  Location: [facility] ENDOSCOPY;  Service: Cardiovascular;  Laterality: N/A;  . HAND SURGERY    . KNEE SURGERY    . TEE WITHOUT CARDIOVERSION N/A 01/01/2020   Procedure: TRANSESOPHAGEAL ECHOCARDIOGRAM (TEE);  Surgeon: Parke Poisson, MD;  Location: [facility] ENDOSCOPY;  Service: Cardiovascular;  Laterality: N/A;    Current Medications: Current Meds  Medication Sig  . amiodarone (PACERONE) 200 MG tablet Take 2 tablets (400 mg total) by mouth 2 (two) times daily.  Marland Kitchen apixaban (ELIQUIS) 5 MG TABS tablet Take 1 tablet (5 mg total) by mouth 2 (  two) times daily.  . furosemide (LASIX) 40 MG tablet Take 1 tablet (40 mg total) by mouth daily.  . metoprolol succinate (TOPROL-XL) 100 MG 24 hr tablet Take 1 tablet (100 mg total) by mouth daily. Take with or immediately following a meal.  . nitroGLYCERIN (NITROSTAT) 0.4 MG SL tablet Place 1 tablet (0.4 mg total) under the tongue every 5 (five) minutes as needed for chest pain.  . sacubitril-valsartan (ENTRESTO) 24-26 MG Take 1 tablet by mouth 2 (two) times daily.     Allergies:   Patient has no known allergies.   Social History    Socioeconomic History  . Marital status: Married    Spouse name: Not on file  . Number of children: Not on file  . Years of education: Not on file  . Highest education level: Not on file  Occupational History  . Not on file  Tobacco Use  . Smoking status: Former Games developer  . Smokeless tobacco: Never Used  Substance and Sexual Activity  . Alcohol use: Yes    Comment: occ  . Drug use: Never  . Sexual activity: Not on file  Other Topics Concern  . Not on file  Social History Narrative  . Not on file   Social Determinants of Health   Financial Resource Strain:   . Difficulty of Paying Living Expenses: Not on file  Food Insecurity:   . Worried About Programme researcher, broadcasting/film/video in the Last Year: Not on file  . Ran Out of Food in the Last Year: Not on file  Transportation Needs:   . Lack of Transportation (Medical): Not on file  . Lack of Transportation (Non-Medical): Not on file  Physical Activity:   . Days of Exercise per Week: Not on file  . Minutes of Exercise per Session: Not on file  Stress:   . Feeling of Stress : Not on file  Social Connections:   . Frequency of Communication with Friends and Family: Not on file  . Frequency of Social Gatherings with Friends and Family: Not on file  . Attends Religious Services: Not on file  . Active Member of Clubs or Organizations: Not on file  . Attends Banker Meetings: Not on file  . Marital Status: Not on file     Family History: The patient's family history includes Atrial fibrillation in his brother.  ROS:   Please see the history of present illness.     All other systems reviewed and are negative.  EKGs/Labs/Other Studies Reviewed:    The following studies were reviewed today:  TTE 12/29/18 1. Left ventricular ejection fraction, by visual estimation, is 30 to 35%. The left ventricle has severely decreased function. There is mildly increased left ventricular hypertrophy. 2. Left ventricular diastolic function  could not be evaluated. 3. The left ventricle demonstrates global hypokinesis. 4. Global right ventricle has mildly reduced systolic function.The right ventricular size is normal. No increase in right ventricular wall thickness. 5. Left atrial size was moderately dilated. 6. Right atrial size was moderately dilated. 7. The mitral valve is abnormal. Mild mitral valve regurgitation. 8. The tricuspid valve is grossly normal. 9. The aortic valve was not well visualized. Aortic valve regurgitation is not visualized. 10. The pulmonic valve was grossly normal. Pulmonic valve regurgitation is trivial. 11. Moderately elevated pulmonary artery systolic pressure. 12. The inferior vena cava is dilated in size with <50% respiratory variability, suggesting right atrial pressure of 15 mmHg.  TEE 01/01/20: 1. Left ventricular ejection fraction, by  visual estimation, is 20 to 25%. The left ventricle has severely decreased function. There is mildly increased left ventricular hypertrophy. No left ventricular apical thrombus seen. 2. The left ventricle demonstrates global hypokinesis. 3. Global right ventricle has moderately reduced systolic function.The right ventricular size is normal. No increase in right ventricular wall thickness. 4. Left atrial size was moderately dilated. No LA or LAA thrombus seen. 5. Right atrial size was severely dilated. 6. The mitral valve is normal in structure. Mild mitral valve regurgitation. 7. Small patent foramen ovale with predominantly right to left shunting across the atrial septum with inspiration. 8. Evidence of atrial level shunting detected by color flow Doppler. 9. There is mild dilatation at the level of the sinuses of Valsalva measuring 42 mm. 10. The aortic valve is tricuspid. Aortic valve regurgitation is not visualized. 11. The tricuspid valve is normal in structure. 12. The pulmonic valve was normal in structure. Pulmonic valve regurgitation is  trivial. 13. Trivial pericardial effusion is present.  EKG:  EKG is  ordered today.  The ekg ordered today demonstrates atrial fibrillation with ventricular rate 94, QTc 500 ms  Recent Labs: 12/29/2019: B Natriuretic Peptide 34.0 12/30/2019: ALT 38; Hemoglobin 13.7; Platelets 336; TSH 0.790 01/01/2020: Magnesium 2.0 01/03/2020: BUN 20; Creatinine, Ser 1.08; Potassium 4.9; Sodium 138  Recent Lipid Panel No results found for: CHOL, TRIG, HDL, CHOLHDL, VLDL, LDLCALC, LDLDIRECT  Physical Exam:    VS:  BP (!) 132/98   Pulse 94   Ht 5\' 6"  (1.676 m)   Wt 262 lb (118.8 kg)   BMI 42.29 kg/m     Wt Readings from Last 3 Encounters:  01/11/20 262 lb (118.8 kg)  01/03/20 261 lb (118.4 kg)  01/01/20 266 lb (120.7 kg)     GEN:  Well nourished, well developed in no acute distress HEENT: Normal NECK: No JVD; No carotid bruits LYMPHATICS: No lymphadenopathy CARDIAC: irregular rhythm, regular rate, no murmurs, rubs, gallops RESPIRATORY:  Clear to auscultation without rales, wheezing or rhonchi  ABDOMEN: Soft, non-tender, non-distended MUSCULOSKELETAL:  No edema; No deformity  SKIN: Warm and dry NEUROLOGIC:  Alert and oriented x 3 PSYCHIATRIC:  Normal affect   ASSESSMENT:    1. Persistent atrial fibrillation (HCC)   2. Essential hypertension   3. Atrial flutter with rapid ventricular response (HCC)   4. Chronic anticoagulation   5. Chronic combined systolic and diastolic heart failure (HCC)   6. Atherosclerosis of coronary artery of native heart without angina pectoris, unspecified vessel or lesion type    PLAN:    In order of problems listed above:  Persistent atrial flutter/fibrillation Chronic anticoagulation - Status post TEE guided DCCV on 01/01/2020 - He was unaware of his rhythm - Toprol 100 mg daily - Amiodarone 400 mg twice daily - started on 01/03/20 - Anticoagulated with apixaban 5 mg twice daily - He has not missed any doses of eliquis - will schedule DCCV on Feb 5,  decrease to amiodarone 200 mg on Feb 6, then follow up with EP on Feb 8   Chronic systolic and diastolic heart failure - EF 30-35% on 12/30/19 and then 20-25% on TEE on 01/01/20 - Thought to be tachycardia mediated - Toprol 100 mg daily -  he will likely not tolerate RVR without CHF exacerbation - he is doing a great job with low sodium diet, weighing and vitals daily - he appears euvolemic today - will likely be able to titrate entresto when he sees Dr. 01/03/20 on Feb 8 -  will collect BMP today   Chest pain Coronary artery calcification and aortic atherosclerosis on CT scan - unable to interrupt eliquis following DCCV - will obtain CT coronary once sinus rhythm is re-established - scheduled for March 1   Hypertension - pressures well-controlled, but still room to titrate entresto in another week  Follow up has been made. DCCV in 1 week EP in 2 weeks Dr. Sallyanne Kuster in 1 month  Medication Adjustments/Labs and Tests Ordered: Current medicines are reviewed at length with the patient today.  Concerns regarding medicines are outlined above.  Orders Placed This Encounter  Procedures  . Basic metabolic panel  . EKG 12-Lead   No orders of the defined types were placed in this encounter.   Signed, Ledora Bottcher, [place]  01/11/2020 4:50 PM    [facility]

## 2020-01-11 ENCOUNTER — Encounter: Payer: Self-pay | Admitting: Physician Assistant

## 2020-01-11 ENCOUNTER — Ambulatory Visit (INDEPENDENT_AMBULATORY_CARE_PROVIDER_SITE_OTHER): Payer: Medicare Other | Admitting: Physician Assistant

## 2020-01-11 ENCOUNTER — Other Ambulatory Visit: Payer: Self-pay

## 2020-01-11 VITALS — BP 132/98 | HR 94 | Ht 66.0 in | Wt 262.0 lb

## 2020-01-11 DIAGNOSIS — I4892 Unspecified atrial flutter: Secondary | ICD-10-CM

## 2020-01-11 DIAGNOSIS — I10 Essential (primary) hypertension: Secondary | ICD-10-CM

## 2020-01-11 DIAGNOSIS — I4819 Other persistent atrial fibrillation: Secondary | ICD-10-CM | POA: Diagnosis not present

## 2020-01-11 DIAGNOSIS — I5042 Chronic combined systolic (congestive) and diastolic (congestive) heart failure: Secondary | ICD-10-CM

## 2020-01-11 DIAGNOSIS — Z7901 Long term (current) use of anticoagulants: Secondary | ICD-10-CM | POA: Diagnosis not present

## 2020-01-11 DIAGNOSIS — I2510 Atherosclerotic heart disease of native coronary artery without angina pectoris: Secondary | ICD-10-CM

## 2020-01-11 NOTE — Patient Instructions (Signed)
Medication Instructions:   Amiodarone: Take 200 mg twice daily. On February 6th, decrease to 200 mg once daily.  *If you need a refill on your cardiac medications before your next appointment, please call your pharmacy*  Lab Work: Your physician recommends that you return for lab work today: BMET  If you have labs (blood work) drawn today and your tests are completely normal, you will receive your results only by: Marland Kitchen MyChart Message (if you have MyChart) OR . A paper copy in the mail If you have any lab test that is abnormal or we need to change your treatment, we will call you to review the results.  Testing/Procedures:  You are scheduled for a Cardioversion on Friday, February 5th with Dr. Jens Som.  Please arrive at the [facility] (Main Entrance A) at [facility]: [address] [place], [place] [address] at 9:30 am. (1 hour prior to procedure unless lab work is needed; if lab work is needed arrive 1.5 hours ahead)  DIET: Nothing to eat or drink after midnight except a sip of water with medications (see medication instructions below)  Medication Instructions: Continue your anticoagulant: Eliquis You will need to continue your anticoagulant after your procedure until you  are told by your  Provider that it is safe to stop  You must have a responsible person to drive you home and stay in the waiting area during your procedure. Failure to do so could result in cancellation.  Bring your insurance cards.  *Special Note: Every effort is made to have your procedure done on time. Occasionally there are emergencies that occur at the hospital that may cause delays. Please be patient if a delay does occur.   COVID TEST:  You are scheduled to have a COVID test done on Tuesday, 2/2 at 1:00 PM at [facility].  [facility]  (Short Stay Entrance)  [address]  [place], [place] 8am to noon for procedural patients. Tuesday through Friday from 8am to 3:30. All patients  being tested must be in line by 3:30 to be served.    Follow-Up: At [facility], you and your health needs are our priority.  As part of our continuing mission to provide you with exceptional heart care, we have created designated Provider Care Teams.  These Care Teams include your primary Cardiologist (physician) and Advanced Practice Providers (APPs -  Physician Assistants and Nurse Practitioners) who all work together to provide you with the care you need, when you need it.  Your next appointment:   1 month(s)  The format for your next appointment:   In Person  Provider:   Thurmon Fair, MD   Please keep your follow-up appointment with Dr. Elberta Fortis on Feb 8th.

## 2020-01-12 LAB — BASIC METABOLIC PANEL
BUN/Creatinine Ratio: 19 (ref 10–24)
BUN: 23 mg/dL (ref 8–27)
CO2: 22 mmol/L (ref 20–29)
Calcium: 10 mg/dL (ref 8.6–10.2)
Chloride: 100 mmol/L (ref 96–106)
Creatinine, Ser: 1.24 mg/dL (ref 0.76–1.27)
GFR calc Af Amer: 70 mL/min/{1.73_m2} (ref >59)
GFR calc non Af Amer: 61 mL/min/{1.73_m2} (ref >59)
Glucose: 108 mg/dL — ABNORMAL HIGH (ref 65–99)
Potassium: 5.6 mmol/L — ABNORMAL HIGH (ref 3.5–5.2)
Sodium: 138 mmol/L (ref 134–144)

## 2020-01-14 NOTE — Progress Notes (Signed)
Yes, thank you.

## 2020-01-15 ENCOUNTER — Telehealth: Payer: Self-pay

## 2020-01-15 NOTE — Telephone Encounter (Signed)
Called pt and made appt for 2/2 with Corine Shelter, PA for medication reconciliation and weight gain.

## 2020-01-15 NOTE — Telephone Encounter (Signed)
-----   Message from Marcelino Duster, [place] sent at 01/13/2020  8:12 AM EST ----- I called the patient for elevated potassium and asked him to stop taking entresto for now. He will take an extra lasix for weight gain of 3 lbs in a day or 5 lbs in a week. He keeps diligent notes with weights and BP. He will need a virtual visit this week to go over weights and BP for medication adjustment in the absence of entresto.   Please make virtual appt this week.

## 2020-01-16 ENCOUNTER — Encounter: Payer: Self-pay | Admitting: Cardiology

## 2020-01-16 ENCOUNTER — Other Ambulatory Visit (HOSPITAL_COMMUNITY): Payer: PRIVATE HEALTH INSURANCE

## 2020-01-16 ENCOUNTER — Other Ambulatory Visit: Payer: Self-pay

## 2020-01-16 ENCOUNTER — Other Ambulatory Visit (HOSPITAL_COMMUNITY)
Admission: RE | Admit: 2020-01-16 | Discharge: 2020-01-16 | Disposition: A | Discharge status: Home / Self Care | Payer: Medicare Other | Source: Ambulatory Visit | Attending: Physician Assistant | Admitting: Physician Assistant

## 2020-01-16 ENCOUNTER — Telehealth: Payer: Self-pay

## 2020-01-16 ENCOUNTER — Telehealth (INDEPENDENT_AMBULATORY_CARE_PROVIDER_SITE_OTHER): Payer: Medicare Other | Admitting: Cardiology

## 2020-01-16 VITALS — BP 160/90 | HR 56 | Ht 67.0 in | Wt 256.0 lb

## 2020-01-16 DIAGNOSIS — Z20822 Contact with and (suspected) exposure to covid-19: Secondary | ICD-10-CM | POA: Diagnosis not present

## 2020-01-16 DIAGNOSIS — I4892 Unspecified atrial flutter: Secondary | ICD-10-CM

## 2020-01-16 DIAGNOSIS — I10 Essential (primary) hypertension: Secondary | ICD-10-CM

## 2020-01-16 DIAGNOSIS — I2510 Atherosclerotic heart disease of native coronary artery without angina pectoris: Secondary | ICD-10-CM

## 2020-01-16 DIAGNOSIS — Z7901 Long term (current) use of anticoagulants: Secondary | ICD-10-CM

## 2020-01-16 DIAGNOSIS — Z01812 Encounter for preprocedural laboratory examination: Secondary | ICD-10-CM | POA: Diagnosis present

## 2020-01-16 DIAGNOSIS — E875 Hyperkalemia: Secondary | ICD-10-CM | POA: Insufficient documentation

## 2020-01-16 LAB — SARS CORONAVIRUS 2 (TAT 6-24 HRS): SARS Coronavirus 2: NEGATIVE

## 2020-01-16 MED ORDER — AMLODIPINE BESYLATE 5 MG PO TABS: 5.0000 mg | ORAL_TABLET | Freq: Every day | ORAL | 2 refills | Status: DC

## 2020-01-16 MED ORDER — METOPROLOL SUCCINATE ER 100 MG PO TB24: 100.0000 mg | ORAL_TABLET | Freq: Every day | ORAL | 0 refills | Status: DC

## 2020-01-16 NOTE — Progress Notes (Signed)
Seeing frequent hemolyzed specimens on our outpatients with artifactual hyperkalemia. Please recheck BMEt as you were planning

## 2020-01-16 NOTE — Progress Notes (Signed)
Virtual Visit via Telephone Note   This visit type was conducted due to national recommendations for restrictions regarding the COVID-19 Pandemic (e.g. social distancing) in an effort to limit this patient's exposure and mitigate transmission in our community.  Due to his co-morbid illnesses, this patient is at least at moderate risk for complications without adequate follow up.  This format is felt to be most appropriate for this patient at this time.  The patient did not have access to video technology/had technical difficulties with video requiring transitioning to audio format only (telephone).  All issues noted in this document were discussed and addressed.  No physical exam could be performed with this format.  Please refer to the patient's chart for his  consent to telehealth for [facility].   Date:  01/16/2020   ID:  Danny Mccormick, DOB [birth year], MRN [id number]  Patient Location: Home Provider Location: Home  PCP:  Carylon Perches, MD  Cardiologist:  Thurmon Fair, MD  Electrophysiologist:  Regan Lemming, MD   Evaluation Performed:  Follow-Up Visit  Chief Complaint:  Elevated B/P  History of Present Illness:    Danny Mccormick is a 64 y.o. male with history of hypertension who presented in January 2021 with new congestive heart failure and was found to be in atrial flutter with RVR.  His ejection fraction was 30 to 35% initially.  CTA of his chest showed coronary calcifications.  Rate control was difficult and he ultimately underwent TEE guided cardioversion on 01/01/2020.  His ejection fraction at that time was noted to be 20 to 25%.  He was discharged but EMS was called was home on 01/02/2020 for recurrent PAF.  His metoprolol was increased to 100 mg twice daily.  He was seen in follow-up 01/03/2020 and loaded with amiodarone 400 mg twice daily with plans for repeat cardioversion.  His Toprol was decreased to 100 mg daily at that visit as well.  The patient had labs drawn  01/11/2020 which revealed a potassium of 5.6.  He was told to stop his Entresto.  He was contacted today after he called and noted that his blood pressure has been drifting up.  The patient says as far as dyspnea goes he feels good.  His heart rate is in the 50s to 60s.  His blood pressure this morning was 160/90.  The patient does not have symptoms concerning for COVID-19 infection (fever, chills, cough, or new shortness of breath).    Past Medical History:  Diagnosis Date  . Arthritis   . Atrial fibrillation with RVR (HCC) 01/01/2020  . Eczema   . Hypertension    Past Surgical History:  Procedure Laterality Date  . BUBBLE STUDY  01/01/2020   Procedure: BUBBLE STUDY;  Surgeon: Parke Poisson, MD;  Location: [facility] ENDOSCOPY;  Service: Cardiovascular;;  . CARDIOVERSION N/A 01/01/2020   Procedure: CARDIOVERSION;  Surgeon: Parke Poisson, MD;  Location: [facility] ENDOSCOPY;  Service: Cardiovascular;  Laterality: N/A;  . HAND SURGERY    . KNEE SURGERY    . TEE WITHOUT CARDIOVERSION N/A 01/01/2020   Procedure: TRANSESOPHAGEAL ECHOCARDIOGRAM (TEE);  Surgeon: Parke Poisson, MD;  Location: [facility] ENDOSCOPY;  Service: Cardiovascular;  Laterality: N/A;     Current Meds  Medication Sig  . acetaminophen (TYLENOL) 500 MG tablet Take 500-1,000 mg by mouth every 6 (six) hours as needed for moderate pain.  Marland Kitchen amiodarone (PACERONE) 200 MG tablet Take 2 tablets (400 mg total) by mouth 2 (two) times daily.  Marland Kitchen  apixaban (ELIQUIS) 5 MG TABS tablet Take 1 tablet (5 mg total) by mouth 2 (two) times daily.  Marland Kitchen Apremilast (OTEZLA) 30 MG TABS Take 30 mg by mouth 2 (two) times daily.  . furosemide (LASIX) 40 MG tablet Take 1 tablet (40 mg total) by mouth daily. (Patient taking differently: Take 40 mg by mouth 2 (two) times daily. )  . metoprolol succinate (TOPROL-XL) 100 MG 24 hr tablet Take 1 tablet (100 mg total) by mouth daily. Take with or immediately following a meal.  . nitroGLYCERIN (NITROSTAT) 0.4 MG SL tablet  Place 1 tablet (0.4 mg total) under the tongue every 5 (five) minutes as needed for chest pain.  . sacubitril-valsartan (ENTRESTO) 24-26 MG Take 1 tablet by mouth 2 (two) times daily.  . Salicylic Acid (GOLD BOND PSORIASIS RELIEF EX) Apply 1 application topically daily as needed (psoriasis).     Allergies:   Patient has no known allergies.   Social History   Tobacco Use  . Smoking status: Former Games developer  . Smokeless tobacco: Never Used  Substance Use Topics  . Alcohol use: Yes    Comment: occ  . Drug use: Never     Family Hx: The patient's family history includes Atrial fibrillation in his brother.  ROS:   Please see the history of present illness.    All other systems reviewed and are negative.   Prior CV studies:   The following studies were reviewed today: TEE 20-Jan-2020  Labs/Other Tests and Data Reviewed:    EKG:  An ECG dated 01/11/2020 was personally reviewed today and demonstrated:  AF-HR 94- QTc 500  Recent Labs: 12/29/2019: B Natriuretic Peptide 34.0 12/30/2019: ALT 38; Hemoglobin 13.7; Platelets 336; TSH 0.790 01/20/20: Magnesium 2.0 01/11/2020: BUN 23; Creatinine, Ser 1.24; Potassium 5.6; Sodium 138   Recent Lipid Panel No results found for: CHOL, TRIG, HDL, CHOLHDL, LDLCALC, LDLDIRECT  Wt Readings from Last 3 Encounters:  01/16/20 256 lb (116.1 kg)  01/11/20 262 lb (118.8 kg)  01/03/20 261 lb (118.4 kg)     Objective:    Vital Signs:  BP (!) 160/90   Pulse (!) 56   Ht 5\' 7"  (1.702 m)   Wt 256 lb (116.1 kg)   BMI 40.10 kg/m    VITAL SIGNS:  reviewed  ASSESSMENT & PLAN:    HTN- B/P drifting up off Entresto-add Norvasc 5 mg daily.  PAF- Plan is to complete Amiodarone loading as outline in previous notes and proceed with OP DCCV 01/19/2020. He has a f/u with EP 01/22/2020. I told him to decrease Toprol to 50 mg if his HR was 55 or less.   Cardiomyopathy- Etiology not yet determined. No CHF currently per his history.   CAD- Unable to do  diagnostic cath secondary to the need for uninterrupted anticoagulation post cardioversion.  He is scheduled for coronary CTA march 1st.   Anticoagulation- On Eliquis 5 mg since 01/20/2020. CHADs VASc=4  Plan:  Add Norvasc 5 mg for HTN.  Check BMP today or tomorrow.  DCCV Friday- EP f/u next week.  COVID-19 Education: The signs and symptoms of COVID-19 were discussed with the patient and how to seek care for testing (follow up with PCP or arrange E-visit).  The importance of social distancing was discussed today.  Time:   Today, I have spent 15 minutes with the patient with telehealth technology discussing the above problems.     Medication Adjustments/Labs and Tests Ordered: Current medicines are reviewed at length with the patient today.  Concerns regarding medicines are outlined above.   Tests Ordered: No orders of the defined types were placed in this encounter.   Medication Changes: No orders of the defined types were placed in this encounter.   Follow Up:  In Person as scheduled.   Angelena Form, PA-C  01/16/2020 9:19 AM    [facility]

## 2020-01-16 NOTE — Patient Instructions (Addendum)
Medication Instructions:  START Norvasc 5mg  take 1 tablet once a day  DECREASE your Toprolol (Metoprolol Succinate) to 50mg  if your heart rate is 55 or below *If you need a refill on your cardiac medications before your next appointment, please call your pharmacy*  Lab Work: Your physician recommends that you return for lab work in: BMET in the next 1-2 days If you have labs (blood work) drawn today and your tests are completely normal, you will receive your results only by: MyChart Message (if you have MyChart) OR . A paper copy in the mail If you have any lab test that is abnormal or we need to change your treatment, we will call you to review the results.  Testing/Procedures: None   Follow-Up: At [facility], you and your health needs are our priority.  As part of our continuing mission to provide you with exceptional heart care, we have created designated Provider Care Teams.  These Care Teams include your primary Cardiologist (physician) and Advanced Practice Providers (APPs -  Physician Assistants and Nurse Practitioners) who all work together to provide you with the care you need, when you need it.  Your next appointment:    FOLLOW UP AS SCHEDULED  The format for your next appointment:   In Person  Provider:   Marland Kitchen, MD  Other Instructions

## 2020-01-16 NOTE — Telephone Encounter (Signed)

## 2020-01-16 NOTE — Telephone Encounter (Signed)
Contacted patient to discuss AVS Instructions. Gave patient Luke's recommendations from today's virtual office visit. Informed patient to follow up as scheduled. Patient voiced understanding; AVS printed and mailed to patient.

## 2020-01-17 LAB — BASIC METABOLIC PANEL
BUN/Creatinine Ratio: 24 (ref 10–24)
BUN: 30 mg/dL — ABNORMAL HIGH (ref 8–27)
CO2: 23 mmol/L (ref 20–29)
Calcium: 9.9 mg/dL (ref 8.6–10.2)
Chloride: 97 mmol/L (ref 96–106)
Creatinine, Ser: 1.27 mg/dL (ref 0.76–1.27)
GFR calc Af Amer: 68 mL/min/{1.73_m2} (ref >59)
GFR calc non Af Amer: 59 mL/min/{1.73_m2} — ABNORMAL LOW (ref >59)
Glucose: 131 mg/dL — ABNORMAL HIGH (ref 65–99)
Potassium: 5 mmol/L (ref 3.5–5.2)
Sodium: 135 mmol/L (ref 134–144)

## 2020-01-19 ENCOUNTER — Encounter (HOSPITAL_COMMUNITY): Payer: Self-pay | Admitting: Cardiology

## 2020-01-19 ENCOUNTER — Ambulatory Visit (HOSPITAL_COMMUNITY): Payer: Medicare Other | Admitting: Certified Registered"

## 2020-01-19 ENCOUNTER — Ambulatory Visit (HOSPITAL_COMMUNITY)
Admission: RE | Admit: 2020-01-19 | Discharge: 2020-01-19 | Disposition: A | Discharge status: Home / Self Care | Payer: Medicare Other | Attending: Cardiology | Admitting: Cardiology

## 2020-01-19 ENCOUNTER — Encounter (HOSPITAL_COMMUNITY)
Admission: RE | Disposition: A | Discharge status: Home / Self Care | Payer: Self-pay | Source: Home / Self Care | Attending: Cardiology

## 2020-01-19 DIAGNOSIS — I2510 Atherosclerotic heart disease of native coronary artery without angina pectoris: Secondary | ICD-10-CM | POA: Insufficient documentation

## 2020-01-19 DIAGNOSIS — I4819 Other persistent atrial fibrillation: Secondary | ICD-10-CM | POA: Insufficient documentation

## 2020-01-19 DIAGNOSIS — I110 Hypertensive heart disease with heart failure: Secondary | ICD-10-CM | POA: Insufficient documentation

## 2020-01-19 DIAGNOSIS — Z7901 Long term (current) use of anticoagulants: Secondary | ICD-10-CM | POA: Insufficient documentation

## 2020-01-19 DIAGNOSIS — I509 Heart failure, unspecified: Secondary | ICD-10-CM | POA: Insufficient documentation

## 2020-01-19 DIAGNOSIS — Z79899 Other long term (current) drug therapy: Secondary | ICD-10-CM | POA: Diagnosis not present

## 2020-01-19 DIAGNOSIS — I429 Cardiomyopathy, unspecified: Secondary | ICD-10-CM | POA: Insufficient documentation

## 2020-01-19 DIAGNOSIS — Z87891 Personal history of nicotine dependence: Secondary | ICD-10-CM | POA: Diagnosis not present

## 2020-01-19 SURGERY — CANCELLED PROCEDURE
Anesthesia: General

## 2020-01-19 NOTE — H&P (Signed)
Telemedicine  01/16/2020 [facility]   Abelino Derrick, [place] [facility]  Atrial flutter with rapid ventricular response (HCC) +4 more Dx  Referred by Carylon Perches, MD Reason for Visit  Additional Documentation  Vitals:   BP 160/90   Pulse 56   Ht 5\' 7"  (1.702 m)  Wt 116.1 kg  BMI 40.10 kg/m  BSA 2.34 m  Flowsheets:   NEWS,  MEWS Score,  Anthropometrics    Encounter Info:   Billing Info,  History,  Allergies,  Detailed Report    All Notes  Progress Notes by , MD at 01/16/2020 9:15 AM Author: 03/15/2020, MD Author Type: Physician Filed: 01/16/2020 9:53 AM  Note Status: Signed Cosign: Cosign Not Required Encounter Date: 01/16/2020  Editor: 03/15/2020, MD (Physician)    Seeing frequent hemolyzed specimens on our outpatients with artifactual hyperkalemia. Please recheck BMEt as you were planning     Progress Notes by Thurmon Fair, PA-C at 01/16/2020 9:15 AM Author: 03/15/2020, PA-C Author Type: Physician Assistant Filed: 01/16/2020 9:33 AM  Note Status: Signed Cosign: Cosign Not Required Encounter Date: 01/16/2020  Editor: 03/15/2020 (Physician Assistant)  Expand AllCollapse All      Virtual Visit via Telephone Note   This visit type was conducted due to national recommendations for restrictions regarding the COVID-19 Pandemic (e.g. social distancing) in an effort to limit this patient's exposure and mitigate transmission in our community.  Due to his co-morbid illnesses, this patient is at least at moderate risk for complications without adequate follow up.  This format is felt to be most appropriate for this patient at this time.  The patient did not have access to video technology/had technical difficulties with video requiring transitioning to audio format only (telephone).  All issues noted in this document were discussed and addressed.  No physical exam could be performed with this format.  Please refer to the patient's  chart for his  consent to telehealth for [facility].   Date:  01/16/2020   ID:  Danny Mccormick, DOB 11-Oct-1956, MRN [id number]/29/[id number]  Patient Location: Home Provider Location: Home  PCP:  [id number], MD            Cardiologist:  Carylon Perches, MD  Electrophysiologist:  Thurmon Fair, MD   Evaluation Performed:  Follow-Up Visit  Chief Complaint:  Elevated B/P  History of Present Illness:    Danny Mccormick is a 64 y.o. male with history of hypertension who presented in January 2021 with new congestive heart failure and was found to be in atrial flutter with RVR.  His ejection fraction was 30 to 35% initially.  CTA of his chest showed coronary calcifications.  Rate control was difficult and he ultimately underwent TEE guided cardioversion on 01/01/2020.  His ejection fraction at that time was noted to be 20 to 25%.  He was discharged but EMS was called was home on 01/02/2020 for recurrent PAF.  His metoprolol was increased to 100 mg twice daily.  He was seen in follow-up 01/03/2020 and loaded with amiodarone 400 mg twice daily with plans for repeat cardioversion.  His Toprol was decreased to 100 mg daily at that visit as well.  The patient had labs drawn 01/11/2020 which revealed a potassium of 5.6.  He was told to stop his Entresto.  He was contacted today after he called and noted that his blood pressure has been drifting up.  The patient says as far as dyspnea  goes he feels good.  His heart rate is in the 50s to 60s.  His blood pressure this morning was 160/90.  The patient does not have symptoms concerning for COVID-19 infection (fever, chills, cough, or new shortness of breath).        Past Medical History:  Diagnosis Date  . Arthritis   . Atrial fibrillation with RVR ([place]) 09-Jan-2020  . Eczema   . Hypertension         Past Surgical History:  Procedure Laterality Date  . BUBBLE STUDY  09-Jan-2020   Procedure: BUBBLE STUDY;  Surgeon: Elouise Munroe, MD;   Location: [facility];  Service: Cardiovascular;;  . CARDIOVERSION N/A 2020/01/09   Procedure: CARDIOVERSION;  Surgeon: Elouise Munroe, MD;  Location: [facility] ENDOSCOPY;  Service: Cardiovascular;  Laterality: N/A;  . HAND SURGERY    . KNEE SURGERY    . TEE WITHOUT CARDIOVERSION N/A 2020/01/09   Procedure: TRANSESOPHAGEAL ECHOCARDIOGRAM (TEE);  Surgeon: Elouise Munroe, MD;  Location: [facility] ENDOSCOPY;  Service: Cardiovascular;  Laterality: N/A;     Active Medications      Current Meds  Medication Sig  . acetaminophen (TYLENOL) 500 MG tablet Take 500-1,000 mg by mouth every 6 (six) hours as needed for moderate pain.  Marland Kitchen amiodarone (PACERONE) 200 MG tablet Take 2 tablets (400 mg total) by mouth 2 (two) times daily.  Marland Kitchen apixaban (ELIQUIS) 5 MG TABS tablet Take 1 tablet (5 mg total) by mouth 2 (two) times daily.  Marland Kitchen Apremilast (OTEZLA) 30 MG TABS Take 30 mg by mouth 2 (two) times daily.  . furosemide (LASIX) 40 MG tablet Take 1 tablet (40 mg total) by mouth daily. (Patient taking differently: Take 40 mg by mouth 2 (two) times daily. )  . metoprolol succinate (TOPROL-XL) 100 MG 24 hr tablet Take 1 tablet (100 mg total) by mouth daily. Take with or immediately following a meal.  . nitroGLYCERIN (NITROSTAT) 0.4 MG SL tablet Place 1 tablet (0.4 mg total) under the tongue every 5 (five) minutes as needed for chest pain.  . sacubitril-valsartan (ENTRESTO) 24-26 MG Take 1 tablet by mouth 2 (two) times daily.  . Salicylic Acid (GOLD BOND PSORIASIS RELIEF EX) Apply 1 application topically daily as needed (psoriasis).       Allergies:   Patient has no known allergies.   Social History        Tobacco Use  . Smoking status: Former Research scientist (life sciences)  . Smokeless tobacco: Never Used  Substance Use Topics  . Alcohol use: Yes    Comment: occ  . Drug use: Never     Family Hx: The patient's family history includes Atrial fibrillation in his brother.  ROS:   Please see the history of present  illness.    All other systems reviewed and are negative.   Prior CV studies:   The following studies were reviewed today: TEE 01/09/2020  Labs/Other Tests and Data Reviewed:    EKG:  An ECG dated 01/11/2020 was personally reviewed today and demonstrated:  AF-HR 94- QTc 500  Recent Labs: 12/29/2019: B Natriuretic Peptide 34.0 12/30/2019: ALT 38; Hemoglobin 13.7; Platelets 336; TSH 0.790 2020/01/09: Magnesium 2.0 01/11/2020: BUN 23; Creatinine, Ser 1.24; Potassium 5.6; Sodium 138   Recent Lipid Panel Labs (Brief)  No results found for: CHOL, TRIG, HDL, CHOLHDL, LDLCALC, LDLDIRECT       Wt Readings from Last 3 Encounters:  01/16/20 256 lb (116.1 kg)  01/11/20 262 lb (118.8 kg)  01/03/20 261 lb (118.4 kg)  Objective:    Vital Signs:  BP (!) 160/90   Pulse (!) 56   Ht 5\' 7"  (1.702 m)   Wt 256 lb (116.1 kg)   BMI 40.10 kg/m    VITAL SIGNS:  reviewed  ASSESSMENT & PLAN:    HTN- B/P drifting up off Entresto-add Norvasc 5 mg daily.  PAF- Plan is to complete Amiodarone loading as outline in previous notes and proceed with OP DCCV 01/19/2020. He has a f/u with EP 01/22/2020. I told him to decrease Toprol to 50 mg if his HR was 55 or less.   Cardiomyopathy- Etiology not yet determined. No CHF currently per his history.   CAD- Unable to do diagnostic cath secondary to the need for uninterrupted anticoagulation post cardioversion.  He is scheduled for coronary CTA march 1st.   Anticoagulation- On Eliquis 5 mg since 01/01/2020. CHADs VASc=4  Plan:  Add Norvasc 5 mg for HTN.  Check BMP today or tomorrow.  DCCV Friday- EP f/u next week.  COVID-19 Education: The signs and symptoms of COVID-19 were discussed with the patient and how to seek care for testing (follow up with PCP or arrange E-visit).  The importance of social distancing was discussed today.  Time:   Today, I have spent 15 minutes with the patient with telehealth technology discussing the above  problems.     Medication Adjustments/Labs and Tests Ordered: Current medicines are reviewed at length with the patient today.  Concerns regarding medicines are outlined above.   Tests Ordered: No orders of the defined types were placed in this encounter.   Medication Changes: No orders of the defined types were placed in this encounter.   Follow Up:  In Person as scheduled.   Signed, Saturday, PA-C  01/16/2020 9:19 AM    [facility]     For DCCV; compliant with apixaban; no changes 03/15/2020

## 2020-01-19 NOTE — Anesthesia Preprocedure Evaluation (Deleted)
Anesthesia Evaluation  Patient identified by MRN, date of birth, ID band Patient awake    Reviewed: Allergy & Precautions, NPO status , Patient's Chart, lab work & pertinent test results, reviewed documented beta blocker date and time   History of Anesthesia Complications Negative for: history of anesthetic complications  Airway Mallampati: II  TM Distance: >3 FB Neck ROM: Full    Dental  (+) Dental Advisory Given, Chipped   Pulmonary former smoker,    Pulmonary exam normal        Cardiovascular hypertension, Pt. on medications and Pt. on home beta blockers + CAD and +CHF  + dysrhythmias Atrial Fibrillation  Rhythm:Irregular Rate:Tachycardia     Neuro/Psych negative neurological ROS  negative psych ROS   GI/Hepatic negative GI ROS, Neg liver ROS,   Endo/Other  Morbid obesity  Renal/GU negative Renal ROS     Musculoskeletal  (+) Arthritis ,   Abdominal (+) + obese,   Peds  Hematology negative hematology ROS (+)   Anesthesia Other Findings  1. Left ventricular ejection fraction, by visual estimation, is 20 to  25%. The left ventricle has severely decreased function. There is mildly  increased left ventricular hypertrophy. No left ventricular apical  thrombus seen.  2. The left ventricle demonstrates global hypokinesis.  3. Global right ventricle has moderately reduced systolic function.The  right ventricular size is normal. No increase in right ventricular wall  thickness.  4. Left atrial size was moderately dilated. No LA or LAA thrombus seen.  5. Right atrial size was severely dilated.  6. The mitral valve is normal in structure. Mild mitral valve  regurgitation.  7. Small patent foramen ovale with predominantly right to left shunting  across the atrial septum with inspiration.  8. Evidence of atrial level shunting detected by color flow Doppler.  9. There is mild dilatation at the level of the  sinuses of Valsalva  measuring 42 mm.  10. The aortic valve is tricuspid. Aortic valve regurgitation is not  visualized.  11. The tricuspid valve is normal in structure.  12. The pulmonic valve was normal in structure. Pulmonic valve  regurgitation is trivial.  13. Trivial pericardial effusion is present.   Reproductive/Obstetrics                             Anesthesia Physical  Anesthesia Plan  ASA: III  Anesthesia Plan: General   Post-op Pain Management:    Induction:   PONV Risk Score and Plan: 2 and Propofol infusion, Treatment may vary due to age or medical condition and TIVA  Airway Management Planned: Natural Airway and Mask  Additional Equipment: None  Intra-op Plan:   Post-operative Plan:   Informed Consent: I have reviewed the patients History and Physical, chart, labs and discussed the procedure including the risks, benefits and alternatives for the proposed anesthesia with the patient or authorized representative who has indicated his/her understanding and acceptance.       Plan Discussed with: CRNA  Anesthesia Plan Comments:         Anesthesia Quick Evaluation

## 2020-01-19 NOTE — Progress Notes (Signed)
Patient presented to endo for cardioversion. Upon checking vitals, he appeared to be in Sinus Rhythm, ordered EKG. EKG showed sinus brady, MD Crenshaw aware. Patient d/c.

## 2020-01-19 NOTE — Interval H&P Note (Signed)
History and Physical Interval Note:  01/19/2020 8:56 AM  Danny Mccormick  has presented today for surgery, with the diagnosis of AFIB.  The various methods of treatment have been discussed with the patient and family. After consideration of risks, benefits and other options for treatment, the patient has consented to  Procedure(s): CARDIOVERSION (N/A) as a surgical intervention.  The patient's history has been reviewed, patient examined, no change in status, stable for surgery.  I have reviewed the patient's chart and labs.  Questions were answered to the patient's satisfaction.     [name]

## 2020-01-19 NOTE — Procedures (Signed)
Electrical Cardioversion Procedure Note Danny Mccormick [id number] [birth year]  Procedure: Electrical Cardioversion Indications:  Atrial Fibrillation  Procedure Details Pt in sinus at time of arrival; procedure canceled; continue present meds  [name] 01/19/2020, 8:57 AM

## 2020-01-22 ENCOUNTER — Ambulatory Visit: Payer: PRIVATE HEALTH INSURANCE | Admitting: Cardiology

## 2020-01-22 MED ORDER — FUROSEMIDE 40 MG PO TABS: 40.0000 mg | ORAL_TABLET | Freq: Every day | ORAL | 3 refills | Status: DC

## 2020-01-24 ENCOUNTER — Other Ambulatory Visit: Payer: Self-pay | Admitting: Cardiology

## 2020-01-25 LAB — BASIC METABOLIC PANEL
BUN/Creatinine Ratio: 20 (ref 10–24)
BUN: 24 mg/dL (ref 8–27)
CO2: 23 mmol/L (ref 20–29)
Calcium: 9.2 mg/dL (ref 8.6–10.2)
Chloride: 101 mmol/L (ref 96–106)
Creatinine, Ser: 1.22 mg/dL (ref 0.76–1.27)
GFR calc Af Amer: 71 mL/min/{1.73_m2} (ref >59)
GFR calc non Af Amer: 62 mL/min/{1.73_m2} (ref >59)
Glucose: 106 mg/dL — ABNORMAL HIGH (ref 65–99)
Potassium: 5.2 mmol/L (ref 3.5–5.2)
Sodium: 139 mmol/L (ref 134–144)

## 2020-01-25 LAB — SPECIMEN STATUS REPORT

## 2020-01-29 ENCOUNTER — Encounter: Payer: Self-pay | Admitting: Cardiology

## 2020-01-29 ENCOUNTER — Other Ambulatory Visit: Payer: Self-pay

## 2020-01-29 ENCOUNTER — Ambulatory Visit (INDEPENDENT_AMBULATORY_CARE_PROVIDER_SITE_OTHER): Payer: Medicare Other | Admitting: Cardiology

## 2020-01-29 VITALS — BP 154/78 | HR 57 | Ht 67.0 in | Wt 260.0 lb

## 2020-01-29 DIAGNOSIS — I483 Typical atrial flutter: Secondary | ICD-10-CM | POA: Diagnosis not present

## 2020-01-29 DIAGNOSIS — I480 Paroxysmal atrial fibrillation: Secondary | ICD-10-CM

## 2020-01-29 DIAGNOSIS — I2510 Atherosclerotic heart disease of native coronary artery without angina pectoris: Secondary | ICD-10-CM | POA: Diagnosis not present

## 2020-01-29 MED ORDER — AMLODIPINE BESYLATE 10 MG PO TABS: 10.0000 mg | ORAL_TABLET | Freq: Every day | ORAL | 3 refills | Status: DC

## 2020-01-29 NOTE — Patient Instructions (Signed)
Medication Instructions:  Your physician has recommended you make the following change in your medication:  1. INCREASE Amlodipine to 10 mg daily   *If you need a refill on your cardiac medications before your next appointment, please call your pharmacy*  Lab Work: Your physician recommends that you return for lab work between: 3/15 - 3/19 at [facility] in [place]:  for a BMET & CBC If you have labs (blood work) drawn today and your tests are completely normal, you will receive your results only by:  MyChart Message (if you have MyChart) OR  A paper copy in the mail If you have any lab test that is abnormal or we need to change your treatment, we will call you to review the results.  Testing/Procedures: Your physician has requested that you have cardiac CT (within 7 days prior to ablation). Cardiac computed tomography (CT) is a painless test that uses an x-ray machine to take clear, detailed pictures of your heart. For further information please visit [email or web address] Please follow instruction sheet as given.  Your physician has recommended that you have an ablation. Catheter ablation is a medical procedure used to treat some cardiac arrhythmias (irregular heartbeats). During catheter ablation, a long, thin, flexible tube is put into a blood vessel in your groin (upper thigh), or neck. This tube is called an ablation catheter. It is then guided to your heart through the blood vessel. Radio frequency waves destroy small areas of heart tissue where abnormal heartbeats may cause an arrhythmia to start. Please see the instruction sheet given to you today.  Your physician has requested that you have an echocardiogram in 3 months, prior to your post ablation follow up with Dr. Elberta Fortis. Echocardiography is a painless test that uses sound waves to create images of your heart. It provides your doctor with information about the size and shape of your heart and how well your hearts chambers and valves  are working. This procedure takes approximately one hour. There are no restrictions for this procedure.   Follow-Up: At [facility], you and your health needs are our priority.  As part of our continuing mission to provide you with exceptional heart care, we have created designated Provider Care Teams.  These Care Teams include your primary Cardiologist (physician) and Advanced Practice Providers (APPs -  Physician Assistants and Nurse Practitioners) who all work together to provide you with the care you need, when you need it.  Your next appointment:   3 month(s)  The format for your next appointment:   In Person  Provider:   Loman Brooklyn, MD  Other Instructions  Your cardiac CT will be scheduled at:  [facility] [address] [place], [place] [address] [phone]  Please arrive at the [facility] main entrance of [facility] on __________ @ ___________, please arrive 30 minutes prior to test start time. Proceed to the [facility] Radiology Department (first floor) to check-in and test prep.  Please follow these instructions carefully (unless otherwise directed):  Hold all erectile dysfunction medications at least 3 days (72 hrs) prior to test.  On the Night Before the Test:  Be sure to Drink plenty of water.  Do not consume any caffeinated/decaffeinated beverages or chocolate 12 hours prior to your test.  Do not take any antihistamines 12 hours prior to your test.  On the Day of the Test:  Drink plenty of water. Do not drink any water within one hour of the test.  Do not eat any  food 4 hours prior to the test.  You may take your regular medications prior to the test.   Take your Toprol the morning of this test.  HOLD Furosemide/Hydrochlorothiazide morning of the test.      After the Test:  Drink plenty of water.  After receiving IV contrast, you may experience a mild flushed feeling. This is normal.  On occasion, you may experience  a mild rash up to 24 hours after the test. This is not dangerous. If this occurs, you can take Benadryl 25 mg and increase your fluid intake.  If you experience trouble breathing, this can be serious. If it is severe call 911 IMMEDIATELY. If it is mild, please call our office.   Once we have confirmed authorization from your insurance company, we will call you to set up a date and time for your test.   For non-scheduling related questions, please contact the cardiac imaging nurse navigator should you have any questions/concerns: Rockwell Alexandria, RN Navigator Cardiac Imaging [facility] [phone] mobile     Electrophysiology/Ablation Procedure Instructions   You are scheduled for a(n)  ablation on 03/13/20 with Dr. Loman Brooklyn.   1.   Pre procedure testing-             A.  LAB WORK --- at [facility], between 3/15 - 3/19 for your pre procedure blood work.                 B. COVID TEST-- On 03/11/20 @ 9:00 am - You will go to [facility].  Stay in your car and the nurse team will come to your car to test you.  After you are tested please go home and self quarantine until the day of your procedure.     2. On the day of your procedure 03/13/20 you will go to [facility] [phone]) at 9:30 am.  Bonita Quin will go to the main entrance [address] [employer]) and enter where the [employer] are.  Your driver will drop you off and you will head down the hallway to ADMITTING.  You may have one support person come in to the hospital with you.  They will be asked to wait in the waiting room.   3.   Do not eat or drink after midnight prior to your procedure.   4.   Do NOT take any medications the morning of your procedure.   5.  Plan for an overnight stay.  If you use your phone frequently bring your phone charger.   6. You will follow up with the AFIB clinic 4 weeks after your procedure.  You will follow up with Dr. Elberta Fortis  3 months after your procedure.   These appointments will be made for you.   * If you have ANY questions please call the office [phone] and ask for Savonna Birchmeier RN or send me a MyChart message   * Occasionally, EP Studies and ablations can become lengthy.  Please make your family aware of this before your procedure starts.  Average time ranges from 2-8 hours for EP studies/ablations.  Your physician will call your family after the procedure with the results.                                    Cardiac Ablation Cardiac ablation is a procedure to disable (ablate) a small  amount of heart tissue in very specific places. The heart has many electrical connections. Sometimes these connections are abnormal and can cause the heart to beat very fast or irregularly. Ablating some of the problem areas can improve the heart rhythm or return it to normal. Ablation may be done for people who:  Have Wolff-Parkinson-White syndrome.  Have fast heart rhythms (tachycardia).  Have taken medicines for an abnormal heart rhythm (arrhythmia) that were not effective or caused side effects.  Have a high-risk heartbeat that may be life-threatening. During the procedure, a small incision is made in the neck or the groin, and a long, thin, flexible tube (catheter) is inserted into the incision and moved to the heart. Small devices (electrodes) on the tip of the catheter will send out electrical currents. A type of X-ray (fluoroscopy) will be used to help guide the catheter and to provide images of the heart. Tell a health care provider about:  Any allergies you have.  All medicines you are taking, including vitamins, herbs, eye drops, creams, and over-the-counter medicines.  Any problems you or family members have had with anesthetic medicines.  Any blood disorders you have.  Any surgeries you have had.  Any medical conditions you have, such as kidney failure.  Whether you are pregnant or may be pregnant. What are the risks? Generally, this is  a safe procedure. However, problems may occur, including:  Infection.  Bruising and bleeding at the catheter insertion site.  Bleeding into the chest, especially into the sac that surrounds the heart. This is a serious complication.  Stroke or blood clots.  Damage to other structures or organs.  Allergic reaction to medicines or dyes.  Need for a permanent pacemaker if the normal electrical system is damaged. A pacemaker is a small computer that sends electrical signals to the heart and helps your heart beat normally.  The procedure not being fully effective. This may not be recognized until months later. Repeat ablation procedures are sometimes required. What happens before the procedure?  Follow instructions from your health care provider about eating or drinking restrictions.  Ask your health care provider about: ? Changing or stopping your regular medicines. This is especially important if you are taking diabetes medicines or blood thinners. ? Taking medicines such as aspirin and ibuprofen. These medicines can thin your blood. Do not take these medicines before your procedure if your health care provider instructs you not to.  Plan to have someone take you home from the hospital or clinic.  If you will be going home right after the procedure, plan to have someone with you for 24 hours. What happens during the procedure?  To lower your risk of infection: ? Your health care team will wash or sanitize their hands. ? Your skin will be washed with soap. ? Hair may be removed from the incision area.  An IV tube will be inserted into one of your veins.  You will be given a medicine to help you relax (sedative).  The skin on your neck or groin will be numbed.  An incision will be made in your neck or your groin.  A needle will be inserted through the incision and into a large vein in your neck or groin.  A catheter will be inserted into the needle and moved to your  heart.  Dye may be injected through the catheter to help your surgeon see the area of the heart that needs treatment.  Electrical currents will be sent from the  catheter to ablate heart tissue in desired areas. There are three types of energy that may be used to ablate heart tissue: ? Heat (radiofrequency energy). ? Laser energy. ? Extreme cold (cryoablation).  When the necessary tissue has been ablated, the catheter will be removed.  Pressure will be held on the catheter insertion area to prevent excessive bleeding.  A bandage (dressing) will be placed over the catheter insertion area. The procedure may vary among health care providers and hospitals. What happens after the procedure?  Your blood pressure, heart rate, breathing rate, and blood oxygen level will be monitored until the medicines you were given have worn off.  Your catheter insertion area will be monitored for bleeding. You will need to lie still for a few hours to ensure that you do not bleed from the catheter insertion area.  Do not drive for 24 hours or as long as directed by your health care provider. Summary  Cardiac ablation is a procedure to disable (ablate) a small amount of heart tissue in very specific places. Ablating some of the problem areas can improve the heart rhythm or return it to normal.  During the procedure, electrical currents will be sent from the catheter to ablate heart tissue in desired areas. This information is not intended to replace advice given to you by your health care provider. Make sure you discuss any questions you have with your health care provider. Document Revised: 05/23/2018 Document Reviewed: 10/19/2016 Elsevier Patient Education  [place].

## 2020-01-29 NOTE — Progress Notes (Signed)
Electrophysiology Office Note   Date:  01/29/2020   ID:  Danny Mccormick, Danny Mccormick Mar 25, 1956, MRN [id number]  PCP:  Asencion Noble, MD  Cardiologist:  [place] Primary Electrophysiologist:  Guy Seese Meredith Leeds, MD    Chief Complaint: AFlutter   History of Present Illness: Danny Mccormick is a 64 y.o. male who is being seen today for the evaluation of atrial flutter at the request of Asencion Noble, MD. Presenting today for electrophysiology evaluation.  He has a history significant for atrial flutter, hypertension.  He presented to his PCPs office January 2021 and was found to be in atrial flutter with heart rates in the 140s.  He went to the [facility] emergency room for further evaluation.  He had been having dyspnea on exertion for 4 weeks but denied any chest pain, orthopnea, PND, fever, or chills.  He had an echo while in the hospital that showed an ejection fraction of 30 to 35%.  He had a TEE cardioversion 01/01/2020.  On 01/02/2020, he had recurrent atrial fibrillation.  He was started on amiodarone which converted him to sinus rhythm.  Today, he denies symptoms of palpitations, chest pain, shortness of breath, orthopnea, PND, lower extremity edema, claudication, dizziness, presyncope, syncope, bleeding, or neurologic sequela. The patient is tolerating medications without difficulties.  He has done well.  He has not had any further episodes of atrial fibrillation.  He continues to get stronger.   Past Medical History:  Diagnosis Date  . Arthritis   . Atrial fibrillation with RVR ([place]) 01/01/2020  . Eczema   . Hypertension    Past Surgical History:  Procedure Laterality Date  . BUBBLE STUDY  01/01/2020   Procedure: BUBBLE STUDY;  Surgeon: Elouise Munroe, MD;  Location: [place];  Service: Cardiovascular;;  . CARDIOVERSION N/A 01/01/2020   Procedure: CARDIOVERSION;  Surgeon: Elouise Munroe, MD;  Location: [facility] ENDOSCOPY;  Service: Cardiovascular;  Laterality: N/A;  . HAND  SURGERY    . KNEE SURGERY    . TEE WITHOUT CARDIOVERSION N/A 01/01/2020   Procedure: TRANSESOPHAGEAL ECHOCARDIOGRAM (TEE);  Surgeon: Elouise Munroe, MD;  Location: [facility] ENDOSCOPY;  Service: Cardiovascular;  Laterality: N/A;     Current Outpatient Medications  Medication Sig Dispense Refill  . acetaminophen (TYLENOL) 500 MG tablet Take 500-1,000 mg by mouth every 6 (six) hours as needed for moderate pain.    Marland Kitchen amiodarone (PACERONE) 200 MG tablet Take 2 tablets (400 mg total) by mouth 2 (two) times daily. 120 tablet 0  . apixaban (ELIQUIS) 5 MG TABS tablet Take 1 tablet (5 mg total) by mouth 2 (two) times daily. 60 tablet 0  . Apremilast (OTEZLA) 30 MG TABS Take 30 mg by mouth 2 (two) times daily.    . furosemide (LASIX) 40 MG tablet Take 1 tablet (40 mg total) by mouth daily. 30 tablet 3  . metoprolol succinate (TOPROL-XL) 100 MG 24 hr tablet Take 1 tablet (100 mg total) by mouth daily. Take with or immediately following a meal. DECREASE TO 50MG  IF HEARTRATE BELOW 55 90 tablet 0  . nitroGLYCERIN (NITROSTAT) 0.4 MG SL tablet Place 1 tablet (0.4 mg total) under the tongue every 5 (five) minutes as needed for chest pain. 90 tablet 3  . sacubitril-valsartan (ENTRESTO) 24-26 MG Take 1 tablet by mouth 2 (two) times daily. 60 tablet 0  . Salicylic Acid (GOLD BOND PSORIASIS RELIEF EX) Apply 1 application topically daily as needed (psoriasis).    Marland Kitchen amLODipine (NORVASC) 10 MG tablet Take  1 tablet (10 mg total) by mouth daily. 180 tablet 3   No current facility-administered medications for this visit.    Allergies:   Patient has no known allergies.   Social History:  The patient  reports that he has quit smoking. He has never used smokeless tobacco. He reports current alcohol use. He reports that he does not use drugs.   Family History:  The patient's family history includes Atrial fibrillation in his brother.    ROS:  Please see the history of present illness.   Otherwise, review of systems is  positive for none.   All other systems are reviewed and negative.    PHYSICAL EXAM: VS:  BP (!) 154/78   Pulse (!) 57   Ht 5\' 7"  (1.702 m)   Wt 260 lb (117.9 kg)   BMI 40.72 kg/m  , BMI Body mass index is 40.72 kg/m. GEN: Well nourished, well developed, in no acute distress  HEENT: normal  Neck: no JVD, carotid bruits, or masses Cardiac: RRR; no murmurs, rubs, or gallops,no edema  Respiratory:  clear to auscultation bilaterally, normal work of breathing GI: soft, nontender, nondistended, + BS MS: no deformity or atrophy  Skin: warm and dry Neuro:  Strength and sensation are intact Psych: euthymic mood, full affect  EKG:  EKG is not ordered today. Personal review of the ekg ordered 01/22/20 shows atrial flutter, rate 139  Recent Labs: 12/29/2019: B Natriuretic Peptide 34.0 12/30/2019: ALT 38; Hemoglobin 13.7; Platelets 336; TSH 0.790 01/01/2020: Magnesium 2.0 01/24/2020: BUN 24; Creatinine, Ser 1.22; Potassium 5.2; Sodium 139    Lipid Panel  No results found for: CHOL, TRIG, HDL, CHOLHDL, VLDL, LDLCALC, LDLDIRECT   Wt Readings from Last 3 Encounters:  01/29/20 260 lb (117.9 kg)  01/16/20 256 lb (116.1 kg)  01/11/20 262 lb (118.8 kg)      Other studies Reviewed: Additional studies/ records that were reviewed today include: TTE 12/30/19  Review of the above records today demonstrates:  1. Left ventricular ejection fraction, by visual estimation, is 30 to  35%. The left ventricle has severely decreased function. There is mildly  increased left ventricular hypertrophy.  2. Left ventricular diastolic function could not be evaluated.  3. The left ventricle demonstrates global hypokinesis.  4. Global right ventricle has mildly reduced systolic function.The right  ventricular size is normal. No increase in right ventricular wall  thickness.  5. Left atrial size was moderately dilated.  6. Right atrial size was moderately dilated.  7. The mitral valve is abnormal. Mild  mitral valve regurgitation.  8. The tricuspid valve is grossly normal.  9. The aortic valve was not well visualized. Aortic valve regurgitation  is not visualized.  10. The pulmonic valve was grossly normal. Pulmonic valve regurgitation is  trivial.  11. Moderately elevated pulmonary artery systolic pressure.  12. The inferior vena cava is dilated in size with <50% respiratory  variability, suggesting right atrial pressure of 15 mmHg.    ASSESSMENT AND PLAN:  1.  Persistent atrial fibrillation/atrial flutter: Currently on amiodarone and Eliquis.  He has had multiple episodes with 2 cardioversions in the last month and a half.  As he does have systolic heart failure, he would benefit from ablation due to the [place] AF trial.  He would like to get off of amiodarone long-term.  Risks and benefits were discussed which include bleeding, tamponade, heart block, stroke, damage to chest organs.  He understands these risks and has agreed to the procedure.  2.  Systolic heart failure: Unknown chronicity.  This is possibly due to a tachycardia induced cardiomyopathy.  He is currently on Toprol-XL and Entresto.  He Jobie Popp need an echo after his ablation prior to his 13-month visit.  3.  Hypertension: Blood pressure elevated today.  Plan to increase Norvasc to 10 mg  Case discussed with primary cardiology  Current medicines are reviewed at length with the patient today.   The patient does not have concerns regarding his medicines.  The following changes were made today: Increase Norvasc  Labs/ tests ordered today include:  Orders Placed This Encounter  Procedures  . CT CARDIAC MORPH/PULM VEIN W/CM&W/O CA SCORE  . CT CORONARY FRACTIONAL FLOW RESERVE DATA PREP  . CT CORONARY FRACTIONAL FLOW RESERVE FLUID ANALYSIS  . Basic metabolic panel  . CBC     Disposition:   FU with Neilah Fulwider 3 months  Signed, El Pile Jorja Loa, MD  01/29/2020 4:13 PM     [facility] [address] [place] [address] [phone] (office) [phone] (fax)

## 2020-01-29 NOTE — Progress Notes (Signed)
Thanks, Will 

## 2020-01-31 ENCOUNTER — Other Ambulatory Visit: Payer: Self-pay | Admitting: Medical

## 2020-01-31 ENCOUNTER — Other Ambulatory Visit: Payer: Self-pay | Admitting: Cardiovascular Disease

## 2020-01-31 MED ORDER — AMIODARONE HCL 200 MG PO TABS: 400.0000 mg | ORAL_TABLET | Freq: Two times a day (BID) | ORAL | 3 refills | Status: DC

## 2020-01-31 NOTE — Telephone Encounter (Signed)
This is Dr. Croitoru's pt 

## 2020-01-31 NOTE — Telephone Encounter (Signed)
*  STAT* If patient is at the pharmacy, call can be transferred to refill team.   1. Which medications need to be refilled? (please list name of each medication and dose if known) Eliquis and Pacerone  2. Which pharmacy/location (including street and city if local pharmacy) is medication to be sent to? [facility] [address] [place]  3. Do they need a 30 day or 90 day supply?  Pacerone #120 and refills- Eliquis #60 and refills

## 2020-02-01 MED ORDER — APIXABAN 5 MG PO TABS: 5.0000 mg | ORAL_TABLET | Freq: Two times a day (BID) | ORAL | 6 refills | Status: DC

## 2020-02-12 ENCOUNTER — Ambulatory Visit (HOSPITAL_COMMUNITY): Payer: PRIVATE HEALTH INSURANCE

## 2020-02-16 ENCOUNTER — Other Ambulatory Visit: Payer: Self-pay

## 2020-02-16 ENCOUNTER — Encounter: Payer: Self-pay | Admitting: Cardiovascular Disease

## 2020-02-16 ENCOUNTER — Ambulatory Visit (INDEPENDENT_AMBULATORY_CARE_PROVIDER_SITE_OTHER): Payer: Medicare Other | Admitting: Cardiovascular Disease

## 2020-02-16 VITALS — BP 159/77 | HR 69 | Temp 97.3°F | Ht 67.0 in | Wt 268.0 lb

## 2020-02-16 DIAGNOSIS — I480 Paroxysmal atrial fibrillation: Secondary | ICD-10-CM | POA: Diagnosis not present

## 2020-02-16 DIAGNOSIS — E875 Hyperkalemia: Secondary | ICD-10-CM

## 2020-02-16 DIAGNOSIS — I5022 Chronic systolic (congestive) heart failure: Secondary | ICD-10-CM | POA: Diagnosis not present

## 2020-02-16 DIAGNOSIS — I483 Typical atrial flutter: Secondary | ICD-10-CM | POA: Diagnosis not present

## 2020-02-16 DIAGNOSIS — I10 Essential (primary) hypertension: Secondary | ICD-10-CM

## 2020-02-16 DIAGNOSIS — I2510 Atherosclerotic heart disease of native coronary artery without angina pectoris: Secondary | ICD-10-CM | POA: Diagnosis not present

## 2020-02-16 MED ORDER — AMIODARONE HCL 200 MG PO TABS: 200.0000 mg | ORAL_TABLET | Freq: Every day | ORAL | 11 refills | Status: DC

## 2020-02-16 MED ORDER — LOSARTAN POTASSIUM 25 MG PO TABS: 25.0000 mg | ORAL_TABLET | Freq: Every day | ORAL | 3 refills | Status: DC

## 2020-02-16 NOTE — Progress Notes (Signed)
Cardiology Office Note:    Date:  02/17/2020   ID:  [name], DOB 01/29/1956, MRN [id number]  PCP:  Carylon Perches, MD  Cardiologist:  Thurmon Fair, MD  Electrophysiologist:  Regan Lemming, MD   Referring MD: Carylon Perches, MD   Chief Complaint  Patient presents with  . Atrial Fibrillation  . Atrial Flutter    History of Present Illness:    Danny Mccormick is a 64 y.o. male with a hx of both atrial flutter and atrial fibrillation recently diagnosed.  He initially presented with atrial flutter in January of this year, complaining of exertional dyspnea, but unaware of palpitations.  Transthoracic echo showed a left ventricular ejection fraction of 30-35% and he underwent cardioversion on January 18, but just the next day he had recurrence with atrial fibrillation.  After starting amiodarone he converted to sinus rhythm.  He is in sinus rhythm today with a rate of about 80 bpm.  He has been monitoring his blood pressure at home where it is in the 147-154/71-77 range.  He denies problems of shortness of breath except if he takes "very long walks".  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  He has been compliant with his anticoagulant without serious bleeding problems (but bleeds freely if he scratches himself).  On March 26, he is scheduled to undergo both coronary CT angiography (to look for ischemic cause of his cardiomyopathy) as well as CT angiography of the left atrium and pulmonary veins, prior to a scheduled radiofrequency ablation with Dr. Elberta Fortis on March 31.  Past Medical History:  Diagnosis Date  . Arthritis   . Atrial fibrillation with RVR ([facility]) 01/01/2020  . Eczema   . Hypertension     Past Surgical History:  Procedure Laterality Date  . BUBBLE STUDY  01/01/2020   Procedure: BUBBLE STUDY;  Surgeon: Parke Poisson, MD;  Location[facility] ENDOSCOPY;  Service: Cardiovascular;;  . CARDIOVERSION N/A 01/01/2020   Procedure:  CARDIOVERSION;  Surgeon: Parke Poisson, MD;  Location: [facility] ENDOSCOPY;  Service: Cardiovascular;  Laterality: N/A;  . HAND SURGERY    . KNEE SURGERY    . TEE WITHOUT CARDIOVERSION N/A 01/01/2020   Procedure: TRANSESOPHAGEAL ECHOCARDIOGRAM (TEE);  Surgeon: Parke Poisson, MD;  Location: [facility] ENDOSCOPY;  Service: Cardiovascular;  Laterality: N/A;    Current Medications: Current Meds  Medication Sig  . acetaminophen (TYLENOL) 500 MG tablet Take 500-1,000 mg by mouth every 6 (six) hours as needed for moderate pain.  Marland Kitchen amiodarone (PACERONE) 200 MG tablet Take 1 tablet (200 mg total) by mouth daily.  Marland Kitchen amLODipine (NORVASC) 10 MG tablet Take 1 tablet (10 mg total) by mouth daily.  Marland Kitchen apixaban (ELIQUIS) 5 MG TABS tablet Take 1 tablet (5 mg total) by mouth 2 (two) times daily.  Marland Kitchen Apremilast (OTEZLA) 30 MG TABS Take 30 mg by mouth 2 (two) times daily.  . furosemide (LASIX) 40 MG tablet Take 1 tablet (40 mg total) by mouth daily.  . metoprolol succinate (TOPROL-XL) 100 MG 24 hr tablet Take 1 tablet (100 mg total) by mouth daily. Take with or immediately following a meal. DECREASE TO 50MG  IF HEARTRATE BELOW 55  . nitroGLYCERIN (NITROSTAT) 0.4 MG SL tablet Place 1 tablet (0.4 mg total) under the tongue every 5 (five) minutes as needed for chest pain.  . Salicylic Acid (GOLD BOND PSORIASIS RELIEF EX) Apply 1 application topically daily as needed (psoriasis).  . [DISCONTINUED] amiodarone (  PACERONE) 200 MG tablet Take 2 tablets (400 mg total) by mouth 2 (two) times daily.     Allergies:   Patient has no known allergies.   Social History   Socioeconomic History  . Marital status: Married    Spouse name: Not on file  . Number of children: Not on file  . Years of education: Not on file  . Highest education level: Not on file  Occupational History  . Not on file  Tobacco Use  . Smoking status: Former Games developer  . Smokeless tobacco: Never Used  Substance and Sexual Activity  . Alcohol use: Yes     Comment: occ  . Drug use: Never  . Sexual activity: Not on file  Other Topics Concern  . Not on file  Social History Narrative  . Not on file   Social Determinants of Health   Financial Resource Strain:   . Difficulty of Paying Living Expenses: Not on file  Food Insecurity:   . Worried About Programme researcher, broadcasting/film/video in the Last Year: Not on file  . Ran Out of Food in the Last Year: Not on file  Transportation Needs:   . Lack of Transportation (Medical): Not on file  . Lack of Transportation (Non-Medical): Not on file  Physical Activity:   . Days of Exercise per Week: Not on file  . Minutes of Exercise per Session: Not on file  Stress:   . Feeling of Stress : Not on file  Social Connections:   . Frequency of Communication with Friends and Family: Not on file  . Frequency of Social Gatherings with Friends and Family: Not on file  . Attends Religious Services: Not on file  . Active Member of Clubs or Organizations: Not on file  . Attends Banker Meetings: Not on file  . Marital Status: Not on file     Family History: The patient's family history includes Atrial fibrillation in his brother.  ROS:   Please see the history of present illness.     All other systems reviewed and are negative.  EKGs/Labs/Other Studies Reviewed:    The following studies were reviewed today: Notes from Dr. Elberta Fortis  EKG:  EKG is ordered today.  It shows normal sinus rhythm and is a completely normal tracing except for borderline QTC 464 ms.  Recent Labs: 12/29/2019: B Natriuretic Peptide 34.0 12/30/2019: ALT 38; Hemoglobin 13.7; Platelets 336; TSH 0.790 01/01/2020: Magnesium 2.0 01/24/2020: BUN 24; Creatinine, Ser 1.22; Potassium 5.2; Sodium 139  Recent Lipid Panel No results found for: CHOL, TRIG, HDL, CHOLHDL, VLDL, LDLCALC, LDLDIRECT  Physical Exam:    VS:  BP (!) 159/77   Pulse 69   Temp (!) 97.3 F (36.3 C)   Ht 5\' 7"  (1.702 m)   Wt 268 lb (121.6 kg)   SpO2 95%   BMI 41.97  kg/m     Wt Readings from Last 3 Encounters:  02/16/20 268 lb (121.6 kg)  01/29/20 260 lb (117.9 kg)  01/16/20 256 lb (116.1 kg)     GEN: Morbidly obese, but also strongly built and muscular, well nourished, well developed in no acute distress HEENT: Normal NECK: No JVD; No carotid bruits LYMPHATICS: No lymphadenopathy CARDIAC: RRR, no murmurs, rubs, gallops RESPIRATORY:  Clear to auscultation without rales, wheezing or rhonchi  ABDOMEN: Soft, non-tender, non-distended MUSCULOSKELETAL:  No edema; No deformity  SKIN: Warm and dry NEUROLOGIC:  Alert and oriented x 3 PSYCHIATRIC:  Normal affect   ASSESSMENT:  1. Chronic systolic heart failure ([place])   2. Typical atrial flutter ([facility])   3. Paroxysmal atrial fibrillation ([place])   4. Hyperkalemia   5. Essential hypertension   6. Morbid obesity ([place])    PLAN:    In order of problems listed above:  1. CHF: Appears clinically euvolemic, NYHA functional class I-2.  Only requiring a low-dose of loop diuretic.  He had been on Entresto but this was stopped when his blood test showed hyperkalemia.  RAAS inhibitors would be preferable to amlodipine.  Start low dose losartan today, recheck basic metabolic panel in a couple of weeks (he will have these checked just before his CT).  Reevaluate left ventricular ejection fraction after at least 3 months of normal rhythm and then see him back in clinic to discuss long-term treatment. 2. AFib/AFlutter: Discussed the difference between the 2 arrhythmiae, in particular insisting on the different response to ablation and the long-term likelihood of recurrence.  It is likely that he will remain on anticoagulation lifelong, but hopefully will be able to stop his amiodarone.  I asked him to decrease his amiodarone to 200 mg once daily today (been on 400 mg twice daily for about a month now).  Continue Eliquis without interruption until his ablation. 3. HTN: Target BP 130/80 or less, preferentially using RAAS  inhibitors and beta-blockers. 4. Morbid obesity: He does not have typical symptoms of obstructive sleep apnea.  Reviewed the linear relationship between weight and arrhythmia burden.   Medication Adjustments/Labs and Tests Ordered: Current medicines are reviewed at length with the patient today.  Concerns regarding medicines are outlined above.  Orders Placed This Encounter  Procedures  . EKG 12-Lead  . ECHOCARDIOGRAM LIMITED   Meds ordered this encounter  Medications  . losartan (COZAAR) 25 MG tablet    Sig: Take 1 tablet (25 mg total) by mouth daily.    Dispense:  90 tablet    Refill:  3  . amiodarone (PACERONE) 200 MG tablet    Sig: Take 1 tablet (200 mg total) by mouth daily.    Dispense:  30 tablet    Refill:  11    Patient Instructions  Medication Instructions:  START Losartan 25 mg once daily  *If you need a refill on your cardiac medications before your next appointment, please call your pharmacy*   Lab Work: None ordered If you have labs (blood work) drawn today and your tests are completely normal, you will receive your results only by: Marland Kitchen MyChart Message (if you have MyChart) OR . A paper copy in the mail If you have any lab test that is abnormal or we need to change your treatment, we will call you to review the results.   Testing/Procedures: Your physician has requested that you have an limited echocardiogram in May. Echocardiography is a painless test that uses sound waves to create images of your heart. It provides your doctor with information about the size and shape of your heart and how well your heart's chambers and valves are working. You may receive an ultrasound enhancing agent through an IV if needed to better visualize your heart during the echo.This procedure takes approximately one hour. There are no restrictions for this procedure. This will take place at the [address], [address].     Follow-Up: At [facility], you and your health needs  are our priority.  As part of our continuing mission to provide you with exceptional heart care, we have created designated Provider Care  Teams.  These Care Teams include your primary Cardiologist (physician) and Advanced Practice Providers (APPs -  Physician Assistants and Nurse Practitioners) who all work together to provide you with the care you need, when you need it.  We recommend signing up for the patient portal called "MyChart".  Sign up information is provided on this After Visit Summary.  MyChart is used to connect with patients for Virtual Visits (Telemedicine).  Patients are able to view lab/test results, encounter notes, upcoming appointments, etc.  Non-urgent messages can be sent to your provider as well.   To learn more about what you can do with MyChart, go to [email or web address].    Your next appointment:   Follow up in June with Dr. Royann Shivers or an APP.    Dr. Royann Shivers would like you to check your blood pressure daily for the next week.  Keep a journal of these daily blood pressure and heart rate readings and call our office or send a message through MyChart with the results. Thank you!     Signed, Thurmon Fair, MD  02/17/2020 4:42 PM    [facility]

## 2020-02-16 NOTE — Patient Instructions (Addendum)
Medication Instructions:  START Losartan 25 mg once daily  *If you need a refill on your cardiac medications before your next appointment, please call your pharmacy*   Lab Work: None ordered If you have labs (blood work) drawn today and your tests are completely normal, you will receive your results only by: Marland Kitchen MyChart Message (if you have MyChart) OR . A paper copy in the mail If you have any lab test that is abnormal or we need to change your treatment, we will call you to review the results.   Testing/Procedures: Your physician has requested that you have an limited echocardiogram in May. Echocardiography is a painless test that uses sound waves to create images of your heart. It provides your doctor with information about the size and shape of your heart and how well your heart's chambers and valves are working. You may receive an ultrasound enhancing agent through an IV if needed to better visualize your heart during the echo.This procedure takes approximately one hour. There are no restrictions for this procedure. This will take place at the [address], [address].     Follow-Up: At [facility], you and your health needs are our priority.  As part of our continuing mission to provide you with exceptional heart care, we have created designated Provider Care Teams.  These Care Teams include your primary Cardiologist (physician) and Advanced Practice Providers (APPs -  Physician Assistants and Nurse Practitioners) who all work together to provide you with the care you need, when you need it.  We recommend signing up for the patient portal called "MyChart".  Sign up information is provided on this After Visit Summary.  MyChart is used to connect with patients for Virtual Visits (Telemedicine).  Patients are able to view lab/test results, encounter notes, upcoming appointments, etc.  Non-urgent messages can be sent to your provider as well.   To learn more about what you can do with  MyChart, go to [email or web address].    Your next appointment:   Follow up in June with Dr. Royann Mccormick or an APP.    Dr. Royann Mccormick would like you to check your blood pressure daily for the next week.  Keep a journal of these daily blood pressure and heart rate readings and call our office or send a message through MyChart with the results. Thank you!

## 2020-02-17 ENCOUNTER — Encounter: Payer: Self-pay | Admitting: Cardiovascular Disease

## 2020-02-29 ENCOUNTER — Other Ambulatory Visit: Payer: Self-pay

## 2020-02-29 DIAGNOSIS — Z01810 Encounter for preprocedural cardiovascular examination: Secondary | ICD-10-CM

## 2020-02-29 DIAGNOSIS — Z79899 Other long term (current) drug therapy: Secondary | ICD-10-CM

## 2020-02-29 DIAGNOSIS — I5022 Chronic systolic (congestive) heart failure: Secondary | ICD-10-CM

## 2020-02-29 MED ORDER — LOSARTAN POTASSIUM 50 MG PO TABS: 50.0000 mg | ORAL_TABLET | Freq: Every day | ORAL | 3 refills | Status: DC

## 2020-03-01 ENCOUNTER — Telehealth (HOSPITAL_COMMUNITY): Payer: Self-pay | Admitting: Nurse Practitioner

## 2020-03-01 NOTE — Telephone Encounter (Signed)
Called patient but VM full, unable to leave message.  Will try to call back.  Pt needs f/u appt 4 wks post ablation per Dr. Elberta Fortis.  Ablation to be done 03/13/20/

## 2020-03-04 NOTE — Telephone Encounter (Signed)
2nd VM left for pt to call to schedule 4 wk f/u from ablation.

## 2020-03-05 NOTE — Telephone Encounter (Signed)
Patient returned my call.  He is aware of appt 04/11/20 @1 :30 with , NP.

## 2020-03-07 ENCOUNTER — Telehealth (HOSPITAL_COMMUNITY): Payer: Self-pay | Admitting: Emergency Medicine

## 2020-03-07 ENCOUNTER — Encounter (HOSPITAL_COMMUNITY): Payer: Self-pay

## 2020-03-07 LAB — CBC
Hematocrit: 40.9 % (ref 37.5–51.0)
Hemoglobin: 13.9 g/dL (ref 13.0–17.7)
MCH: 30.3 pg (ref 26.6–33.0)
MCHC: 34 g/dL (ref 31.5–35.7)
MCV: 89 fL (ref 79–97)
Platelets: 255 10*3/uL (ref 150–450)
RBC: 4.59 x10E6/uL (ref 4.14–5.80)
RDW: 16.6 % — ABNORMAL HIGH (ref 11.6–15.4)
WBC: 7.4 10*3/uL (ref 3.4–10.8)

## 2020-03-07 LAB — BASIC METABOLIC PANEL
BUN/Creatinine Ratio: 21 (ref 10–24)
BUN: 25 mg/dL (ref 8–27)
CO2: 22 mmol/L (ref 20–29)
Calcium: 9.3 mg/dL (ref 8.6–10.2)
Chloride: 105 mmol/L (ref 96–106)
Creatinine, Ser: 1.21 mg/dL (ref 0.76–1.27)
GFR calc Af Amer: 72 mL/min/{1.73_m2} (ref >59)
GFR calc non Af Amer: 62 mL/min/{1.73_m2} (ref >59)
Glucose: 88 mg/dL (ref 65–99)
Potassium: 5 mmol/L (ref 3.5–5.2)
Sodium: 142 mmol/L (ref 134–144)

## 2020-03-07 NOTE — Telephone Encounter (Signed)
Attempted to call patient regarding upcoming cardiac CT appointment. °Left message on voicemail with name and callback number °Kelley Knoth RN Navigator Cardiac Imaging °[facility] °[phone] Office °[phone] Cell ° °

## 2020-03-08 ENCOUNTER — Other Ambulatory Visit: Payer: Self-pay

## 2020-03-08 ENCOUNTER — Telehealth: Payer: Self-pay | Admitting: *Deleted

## 2020-03-08 ENCOUNTER — Ambulatory Visit (HOSPITAL_COMMUNITY)
Admission: RE | Admit: 2020-03-08 | Discharge: 2020-03-08 | Disposition: A | Discharge status: Home / Self Care | Payer: Medicare Other | Source: Ambulatory Visit | Attending: Cardiology | Admitting: Cardiology

## 2020-03-08 DIAGNOSIS — I480 Paroxysmal atrial fibrillation: Secondary | ICD-10-CM | POA: Diagnosis present

## 2020-03-08 MED ORDER — IOHEXOL 350 MG/ML SOLN
80.0000 mL | Freq: Once | INTRAVENOUS | Status: AC | PRN
  Administered 2020-03-08: 80 mL via INTRAVENOUS

## 2020-03-08 NOTE — Telephone Encounter (Signed)
lmtcb  (need to inform pt of time change for procedure next week.  Procedure been moved to first case - pt needs to arrive at 6:30 am

## 2020-03-08 NOTE — Telephone Encounter (Signed)
Pt aware to arrive at 6:30 on 3/31 for his procedure.  He is agreeable to plan.

## 2020-03-11 ENCOUNTER — Other Ambulatory Visit (HOSPITAL_COMMUNITY)
Admission: RE | Admit: 2020-03-11 | Discharge: 2020-03-11 | Disposition: A | Discharge status: Home / Self Care | Payer: Medicare Other | Source: Ambulatory Visit | Attending: Cardiology | Admitting: Cardiology

## 2020-03-11 ENCOUNTER — Other Ambulatory Visit: Payer: Self-pay

## 2020-03-11 DIAGNOSIS — Z01812 Encounter for preprocedural laboratory examination: Secondary | ICD-10-CM | POA: Insufficient documentation

## 2020-03-11 DIAGNOSIS — Z20822 Contact with and (suspected) exposure to covid-19: Secondary | ICD-10-CM | POA: Insufficient documentation

## 2020-03-11 LAB — SARS CORONAVIRUS 2 (TAT 6-24 HRS): SARS Coronavirus 2: NEGATIVE

## 2020-03-12 NOTE — Progress Notes (Signed)
Attempted to call patient regarding instructions for tomorrow's ablation procedure. No answer.

## 2020-03-13 ENCOUNTER — Other Ambulatory Visit: Payer: Self-pay

## 2020-03-13 ENCOUNTER — Encounter (HOSPITAL_COMMUNITY): Payer: Self-pay | Admitting: Cardiology

## 2020-03-13 ENCOUNTER — Ambulatory Visit (HOSPITAL_COMMUNITY): Payer: Medicare Other | Admitting: Certified Registered Nurse Anesthetist

## 2020-03-13 ENCOUNTER — Ambulatory Visit (HOSPITAL_COMMUNITY)
Admission: RE | Disposition: A | Discharge status: Home / Self Care | Payer: Self-pay | Source: Home / Self Care | Attending: Cardiology

## 2020-03-13 ENCOUNTER — Ambulatory Visit (HOSPITAL_COMMUNITY)
Admission: RE | Admit: 2020-03-13 | Discharge: 2020-03-13 | Disposition: A | Discharge status: Home / Self Care | Payer: Medicare Other | Attending: Cardiology | Admitting: Cardiology

## 2020-03-13 DIAGNOSIS — I10 Essential (primary) hypertension: Secondary | ICD-10-CM | POA: Diagnosis not present

## 2020-03-13 DIAGNOSIS — I4819 Other persistent atrial fibrillation: Secondary | ICD-10-CM | POA: Diagnosis present

## 2020-03-13 DIAGNOSIS — M1990 Unspecified osteoarthritis, unspecified site: Secondary | ICD-10-CM | POA: Insufficient documentation

## 2020-03-13 DIAGNOSIS — Z87891 Personal history of nicotine dependence: Secondary | ICD-10-CM | POA: Diagnosis not present

## 2020-03-13 DIAGNOSIS — I483 Typical atrial flutter: Secondary | ICD-10-CM | POA: Diagnosis not present

## 2020-03-13 DIAGNOSIS — Z8249 Family history of ischemic heart disease and other diseases of the circulatory system: Secondary | ICD-10-CM | POA: Diagnosis not present

## 2020-03-13 HISTORY — PX: ATRIAL FIBRILLATION ABLATION: EP1191

## 2020-03-13 LAB — POCT ACTIVATED CLOTTING TIME
Activated Clotting Time: 257 seconds
Activated Clotting Time: 296 seconds
Activated Clotting Time: 290 seconds

## 2020-03-13 SURGERY — ATRIAL FIBRILLATION ABLATION
Anesthesia: General

## 2020-03-13 MED ORDER — PROPOFOL 10 MG/ML IV BOLUS
INTRAVENOUS | Status: DC | PRN
  Administered 2020-03-13: 30 mg via INTRAVENOUS
  Administered 2020-03-13: 70 mg via INTRAVENOUS

## 2020-03-13 MED ORDER — PROTAMINE SULFATE 10 MG/ML IV SOLN
INTRAVENOUS | Status: DC | PRN
  Administered 2020-03-13: 50 mg via INTRAVENOUS

## 2020-03-13 MED ORDER — MIDAZOLAM HCL 5 MG/5ML IJ SOLN
INTRAMUSCULAR | Status: DC | PRN
  Administered 2020-03-13: 2 mg via INTRAVENOUS

## 2020-03-13 MED ORDER — DOBUTAMINE IN D5W 4-5 MG/ML-% IV SOLN
INTRAVENOUS | Status: AC
  Filled 2020-03-13: qty 250

## 2020-03-13 MED ORDER — HEPARIN SODIUM (PORCINE) 1000 UNIT/ML IJ SOLN
INTRAMUSCULAR | Status: DC | PRN
  Administered 2020-03-13: 3000 [IU] via INTRAVENOUS
  Administered 2020-03-13: 5000 [IU] via INTRAVENOUS
  Administered 2020-03-13: 14000 [IU] via INTRAVENOUS
  Administered 2020-03-13: 5000 [IU] via INTRAVENOUS

## 2020-03-13 MED ORDER — SUCCINYLCHOLINE CHLORIDE 20 MG/ML IJ SOLN
INTRAMUSCULAR | Status: DC | PRN
  Administered 2020-03-13: 140 mg via INTRAVENOUS

## 2020-03-13 MED ORDER — SUGAMMADEX SODIUM 200 MG/2ML IV SOLN
INTRAVENOUS | Status: DC | PRN
  Administered 2020-03-13: 200 mg via INTRAVENOUS

## 2020-03-13 MED ORDER — ROCURONIUM BROMIDE 100 MG/10ML IV SOLN
INTRAVENOUS | Status: DC | PRN
  Administered 2020-03-13: 50 mg via INTRAVENOUS
  Administered 2020-03-13: 30 mg via INTRAVENOUS

## 2020-03-13 MED ORDER — SODIUM CHLORIDE 0.9% FLUSH: 3.0000 mL | Freq: Two times a day (BID) | INTRAVENOUS | Status: DC

## 2020-03-13 MED ORDER — HEPARIN SODIUM (PORCINE) 1000 UNIT/ML IJ SOLN
INTRAMUSCULAR | Status: AC
  Filled 2020-03-13: qty 1

## 2020-03-13 MED ORDER — HEPARIN (PORCINE) IN NACL 1000-0.9 UT/500ML-% IV SOLN
INTRAVENOUS | Status: DC | PRN
  Administered 2020-03-13: 500 mL

## 2020-03-13 MED ORDER — SODIUM CHLORIDE 0.9 % IV SOLN: INTRAVENOUS | Status: DC

## 2020-03-13 MED ORDER — PHENYLEPHRINE HCL-NACL 10-0.9 MG/250ML-% IV SOLN
INTRAVENOUS | Status: DC | PRN
  Administered 2020-03-13: 30 ug/min via INTRAVENOUS

## 2020-03-13 MED ORDER — FENTANYL CITRATE (PF) 100 MCG/2ML IJ SOLN
INTRAMUSCULAR | Status: DC | PRN
  Administered 2020-03-13: 100 ug via INTRAVENOUS

## 2020-03-13 MED ORDER — DOBUTAMINE IN D5W 4-5 MG/ML-% IV SOLN
INTRAVENOUS | Status: DC | PRN
  Administered 2020-03-13: 20 ug/kg/min via INTRAVENOUS

## 2020-03-13 MED ORDER — ONDANSETRON HCL 4 MG/2ML IJ SOLN
INTRAMUSCULAR | Status: DC | PRN
  Administered 2020-03-13: 4 mg via INTRAVENOUS

## 2020-03-13 MED ORDER — SODIUM CHLORIDE 0.9 % IV SOLN: 250.0000 mL | INTRAVENOUS | Status: DC | PRN

## 2020-03-13 MED ORDER — EPHEDRINE SULFATE-NACL 50-0.9 MG/10ML-% IV SOSY
PREFILLED_SYRINGE | INTRAVENOUS | Status: DC | PRN
  Administered 2020-03-13: 15 mg via INTRAVENOUS

## 2020-03-13 MED ORDER — SODIUM CHLORIDE 0.9% FLUSH: 3.0000 mL | INTRAVENOUS | Status: DC | PRN

## 2020-03-13 MED ORDER — ONDANSETRON HCL 4 MG/2ML IJ SOLN: 4.0000 mg | Freq: Four times a day (QID) | INTRAMUSCULAR | Status: DC | PRN

## 2020-03-13 MED ORDER — DEXAMETHASONE SODIUM PHOSPHATE 10 MG/ML IJ SOLN
INTRAMUSCULAR | Status: DC | PRN
  Administered 2020-03-13: 5 mg via INTRAVENOUS

## 2020-03-13 MED ORDER — ACETAMINOPHEN 325 MG PO TABS
650.0000 mg | ORAL_TABLET | ORAL | Status: DC | PRN
  Filled 2020-03-13: qty 2

## 2020-03-13 MED ORDER — LIDOCAINE 2% (20 MG/ML) 5 ML SYRINGE
INTRAMUSCULAR | Status: DC | PRN
  Administered 2020-03-13: 40 mg via INTRAVENOUS

## 2020-03-13 SURGICAL SUPPLY — 22 items
BAG SNAP BAND KOVER 36X36 (MISCELLANEOUS) ×3 IMPLANT
BLANKET WARM UNDERBOD FULL ACC (MISCELLANEOUS) ×3 IMPLANT
CATH MAPPNG PENTARAY F 2-6-2MM (CATHETERS) ×1 IMPLANT
CATH SMTCH THERMOCOOL SF DF (CATHETERS) ×3 IMPLANT
CATH SOUNDSTAR ECO 8FR (CATHETERS) ×3 IMPLANT
CATH WEBSTER BI DIR CS D-F CRV (CATHETERS) ×3 IMPLANT
COVER SWIFTLINK CONNECTOR (BAG) ×3 IMPLANT
DEVICE CLOSURE PERCLS PRGLD 6F (VASCULAR PRODUCTS) ×5 IMPLANT
PACK EP LATEX FREE (CUSTOM PROCEDURE TRAY) ×2
PACK EP LF (CUSTOM PROCEDURE TRAY) ×1 IMPLANT
PAD PRO RADIOLUCENT 2001M-C (PAD) ×3 IMPLANT
PATCH CARTO3 (PAD) ×3 IMPLANT
PENTARAY F 2-6-2MM (CATHETERS) ×3
PERCLOSE PROGLIDE 6F (VASCULAR PRODUCTS) ×15
SHEATH BAYLIS SUREFLEX  M 8.5 (SHEATH) ×2
SHEATH BAYLIS SUREFLEX M 8.5 (SHEATH) ×1 IMPLANT
SHEATH BAYLIS TRANSSEPTAL 98CM (NEEDLE) ×3 IMPLANT
SHEATH CARTO VIZIGO SM CVD (SHEATH) ×3 IMPLANT
SHEATH PINNACLE 7F 10CM (SHEATH) ×3 IMPLANT
SHEATH PINNACLE 8F 10CM (SHEATH) ×6 IMPLANT
SHEATH PINNACLE 9F 10CM (SHEATH) ×3 IMPLANT
TUBING SMART ABLATE COOLFLOW (TUBING) ×3 IMPLANT

## 2020-03-13 NOTE — Discharge Instructions (Signed)
Post procedure care instructions No driving for 4 days. No lifting over 5 lbs for 1 week. No vigorous or sexual activity for 1 week. You may return to work/your usual activities on 03/20/2020. Keep procedure site clean & dry. If you notice increased pain, swelling, bleeding or pus, call/return!  You may shower, but no soaking baths/hot tubs/pools for 1 week.   You have an appointment set up with the [facility].  Multiple studies have shown that being followed by a dedicated atrial fibrillation clinic in addition to the standard care you receive from your other physicians improves health. We believe that enrollment in the atrial fibrillation clinic will allow [place] to better care for you.   The phone number to the At[facility]ibrillation Clinic is [phone]. The clinic is staffed Monday through Friday from 8:30am to 5pm.  Parking Directions: The clinic is located in the Heart and Vascular Building connected to [facility]. 1)From [address] turn on to [address] and go to the 3rd entrance  (Heart and Vascular entrance) on the right. 2)Look to the right for Heart &Vascular Parking Garage. 3)A code for the entrance is required please call the clinic to receive this.   4)Take the elevators to the 1st floor. Registration is in the room with the glass walls at the end of the hallway.  If you have any trouble parking or locating the clinic, please don't hesitate to call [phone].    Cardiac Ablation, Care After  This sheet gives you information about how to care for yourself after your procedure. Your health care provider may also give you more specific instructions. If you have problems or questions, contact your health care provider. What can I expect after the procedure? After the procedure, it is common to have:  Bruising around your puncture site.  Tenderness around your puncture site.  Skipped heartbeats.  Tiredness (fatigue).  Follow these instructions at  home: Puncture site care   Follow instructions from your health care provider about how to take care of your puncture site. Make sure you: ? If present, leave stitches (sutures), skin glue, or adhesive strips in place. These skin closures may need to stay in place for up to 2 weeks. If adhesive strip edges start to loosen and curl up, you may trim the loose edges. Do not remove adhesive strips completely unless your health care provider tells you to do that.  Check your puncture site every day for signs of infection. Check for: ? Redness, swelling, or pain. ? Fluid or blood. If your puncture site starts to bleed, lie down on your back, apply firm pressure to the area, and contact your health care provider. ? Warmth. ? Pus or a bad smell. Driving  Do not drive for at least 4 days after your procedure or however long your health care provider recommends. (Do not resume driving if you have previously been instructed not to drive for other health reasons.)  Do not drive or use heavy machinery while taking prescription pain medicine. Activity  Avoid activities that take a lot of effort for at least 7 days after your procedure.  Do not lift anything that is heavier than 5 lb (4.5 kg) for one week.   No sexual activity for 1 week.   Return to your normal activities as told by your health care provider. Ask your health care provider what activities are safe for you. General instructions  Take over-the-counter and prescription medicines only as told by your health  care provider.  Do not use any products that contain nicotine or tobacco, such as cigarettes and e-cigarettes. If you need help quitting, ask your health care provider.  You may shower after 24 hours, but Do not take baths, swim, or use a hot tub for 1 week.   Do not drink alcohol for 24 hours after your procedure.  Keep all follow-up visits as told by your health care provider. This is important. Contact a health care provider  if:  You have redness, mild swelling, or pain around your puncture site.  You have fluid or blood coming from your puncture site that stops after applying firm pressure to the area.  Your puncture site feels warm to the touch.  You have pus or a bad smell coming from your puncture site.  You have a fever.  You have chest pain or discomfort that spreads to your neck, jaw, or arm.  You are sweating a lot.  You feel nauseous.  You have a fast or irregular heartbeat.  You have shortness of breath.  You are dizzy or light-headed and feel the need to lie down.  You have pain or numbness in the arm or leg closest to your puncture site. Get help right away if:  Your puncture site suddenly swells.  Your puncture site is bleeding and the bleeding does not stop after applying firm pressure to the area. These symptoms may represent a serious problem that is an emergency. Do not wait to see if the symptoms will go away. Get medical help right away. Call your local emergency services (911 in the [place] Do not drive yourself to the hospital. Summary  After the procedure, it is normal to have bruising and tenderness at the puncture site in your groin, neck, or forearm.  Check your puncture site every day for signs of infection.  Get help right away if your puncture site is bleeding and the bleeding does not stop after applying firm pressure to the area. This is a medical emergency. This information is not intended to replace advice given to you by your health care provider. Make sure you discuss any questions you have with your health care provider.

## 2020-03-13 NOTE — Progress Notes (Signed)
Discharge instructions given to patient and wife. Both verbalized understanding.

## 2020-03-13 NOTE — Anesthesia Preprocedure Evaluation (Addendum)
Anesthesia Evaluation  Patient identified by MRN, date of birth, ID band Patient awake    Reviewed: Allergy & Precautions, NPO status , Patient's Chart, lab work & pertinent test results  Airway Mallampati: III  TM Distance: >3 FB Neck ROM: Full    Dental  (+) Missing, Dental Advisory Given,    Pulmonary former smoker,    breath sounds clear to auscultation       Cardiovascular hypertension, + CAD  + dysrhythmias Atrial Fibrillation  Rhythm:Regular Rate:Normal     Neuro/Psych negative neurological ROS  negative psych ROS   GI/Hepatic negative GI ROS, Neg liver ROS,   Endo/Other  negative endocrine ROS  Renal/GU negative Renal ROS     Musculoskeletal  (+) Arthritis ,   Abdominal (+) + obese,   Peds  Hematology negative hematology ROS (+)   Anesthesia Other Findings   Reproductive/Obstetrics                             Echo:  1. Left ventricular ejection fraction, by visual estimation, is 20 to  25%. The left ventricle has severely decreased function. There is mildly  increased left ventricular hypertrophy. No left ventricular apical  thrombus seen.  2. The left ventricle demonstrates global hypokinesis.  3. Global right ventricle has moderately reduced systolic function.The  right ventricular size is normal. No increase in right ventricular wall  thickness.  4. Left atrial size was moderately dilated. No LA or LAA thrombus seen.  5. Right atrial size was severely dilated.  6. The mitral valve is normal in structure. Mild mitral valve  regurgitation.  7. Small patent foramen ovale with predominantly right to left shunting  across the atrial septum with inspiration.  8. Evidence of atrial level shunting detected by color flow Doppler.  9. There is mild dilatation at the level of the sinuses of Valsalva  measuring 42 mm.  10. The aortic valve is tricuspid. Aortic valve  regurgitation is not  visualized.  11. The tricuspid valve is normal in structure.  12. The pulmonic valve was normal in structure. Pulmonic valve  regurgitation is trivial.  13. Trivial pericardial effusion is present.   Anesthesia Physical Anesthesia Plan  ASA: IV  Anesthesia Plan: General   Post-op Pain Management:    Induction: Intravenous  PONV Risk Score and Plan: 3 and Ondansetron, Dexamethasone and Midazolam  Airway Management Planned: Oral ETT  Additional Equipment: None  Intra-op Plan:   Post-operative Plan: Extubation in OR  Informed Consent: I have reviewed the patients History and Physical, chart, labs and discussed the procedure including the risks, benefits and alternatives for the proposed anesthesia with the patient or authorized representative who has indicated his/her understanding and acceptance.     Dental advisory given  Plan Discussed with: CRNA  Anesthesia Plan Comments:        Anesthesia Quick Evaluation

## 2020-03-13 NOTE — Transfer of Care (Cosign Needed)
Immediate Anesthesia Transfer of Care Note  Patient: Danny Mccormick  Procedure(s) Performed: ATRIAL FIBRILLATION ABLATION (N/A )  Patient Location: PACU  Anesthesia Type:General  Level of Consciousness: awake and alert   Airway & Oxygen Therapy: Patient Spontanous Breathing and Patient connected to nasal cannula oxygen  Post-op Assessment: Report given to RN and Post -op Vital signs reviewed and stable  Post vital signs: Reviewed and stable  Last Vitals:  Vitals Value Taken Time  BP 151/61 03/13/20 1157  Temp    Pulse 70 03/13/20 1203  Resp 21 03/13/20 1203  SpO2 96 % 03/13/20 1203  Vitals shown include unvalidated device data.  Last Pain:  Vitals:   03/13/20 6834  TempSrc:   PainSc: 0-No pain         Complications: No apparent anesthesia complications

## 2020-03-13 NOTE — H&P (Signed)
Electrophysiology Office Note   Date:  03/13/2020   ID:  Mccormick, Danny 1956-12-13, MRN [id number]  PCP:  Carylon Perches, MD  Cardiologist:  C[facility] Primary Electrophysiologist:  Myrtice Lowdermilk Jorja Loa, MD    Chief Complaint: AFlutter   History of Present Illness: Danny Mccormick is a 64 y.o. male who is being seen today for the evaluation of atrial flutter at the request of No ref. provider found. Presenting today for electrophysiology evaluation.  He has a history significant for atrial flutter, hypertension.  He presented to his PCPs office January 2021 and was found to be in atrial flutter with heart rates in the 140s.  He went to the [facility] emergency room for further evaluation.  He had been having dyspnea on exertion for 4 weeks but denied any chest pain, orthopnea, PND, fever, or chills.  He had an echo while in the hospital that showed an ejection fraction of 30 to 35%.  He had a TEE cardioversion 01/01/2020.  On 01/02/2020, he had recurrent atrial fibrillation.  He was started on amiodarone which converted him to sinus rhythm.  Today, denies symptoms of palpitations, chest pain, shortness of breath, orthopnea, PND, lower extremity edema, claudication, dizziness, presyncope, syncope, bleeding, or neurologic sequela. The patient is tolerating medications without difficulties. Plan for ablation today.    Past Medical History:  Diagnosis Date  . Arthritis   . Atrial fibrillation with RVR (HCC) 01/01/2020  . Eczema   . Hypertension    Past Surgical History:  Procedure Laterality Date  . BUBBLE STUDY  01/01/2020   Procedure: BUBBLE STUDY;  Surgeon: Parke Poisson, MD;  Location: [facility] ENDOSCOPY;  Service: Cardiovascular;;  . CARDIOVERSION N/A 01/01/2020   Procedure: CARDIOVERSION;  Surgeon: Parke Poisson, MD;  Location: [facility] ENDOSCOPY;  Service: Cardiovascular;  Laterality: N/A;  . HAND SURGERY    . KNEE SURGERY    . TEE WITHOUT CARDIOVERSION N/A 01/01/2020   Procedure: TRANSESOPHAGEAL ECHOCARDIOGRAM (TEE);  Surgeon: Parke Poisson, MD;  Location: [facility] ENDOSCOPY;  Service: Cardiovascular;  Laterality: N/A;     Current Facility-Administered Medications  Medication Dose Route Frequency Provider Last Rate Last Admin  . 0.9 %  sodium chloride infusion   Intravenous Continuous Lesley Atkin, Andree Coss, MD        Allergies:   Patient has no known allergies.   Social History:  The patient  reports that he has quit smoking. He has never used smokeless tobacco. He reports current alcohol use. He reports that he does not use drugs.   Family History:  The patient's family history includes Atrial fibrillation in his brother.    ROS:  Please see the history of present illness.   Otherwise, review of systems is positive for none.   All other systems are reviewed and negative.    PHYSICAL EXAM: VS:  BP (!) 194/88   Pulse 66   Temp 98.1 F (36.7 C) (Skin)   Resp 18   Ht 5\' 6"  (1.676 m)   Wt 117.9 kg   SpO2 96%   BMI 41.97 kg/m  , BMI Body mass index is 41.97 kg/m. GEN: Well nourished, well developed, in no acute distress  HEENT: normal  Neck: no JVD, carotid bruits, or masses Cardiac: RRR; no murmurs, rubs, or gallops,no edema  Respiratory:  clear to auscultation bilaterally, normal work of breathing GI: soft, nontender, nondistended, + BS MS: no deformity or atrophy  Skin: warm and dry Neuro:  Strength and sensation are intact  Psych: euthymic mood, full affect   EKG:  EKG is not ordered today. Personal review of the ekg ordered 01/22/20 shows atrial flutter, rate 139  Recent Labs: 12/29/2019: B Natriuretic Peptide 34.0 12/30/2019: ALT 38; TSH 0.790 01/01/2020: Magnesium 2.0 03/06/2020: BUN 25; Creatinine, Ser 1.21; Hemoglobin 13.9; Platelets 255; Potassium 5.0; Sodium 142    Lipid Panel  No results found for: CHOL, TRIG, HDL, CHOLHDL, VLDL, LDLCALC, LDLDIRECT   Wt Readings from Last 3 Encounters:  03/13/20 117.9 kg  02/16/20 121.6 kg    01/29/20 117.9 kg      Other studies Reviewed: Additional studies/ records that were reviewed today include: TTE 12/30/19  Review of the above records today demonstrates:  1. Left ventricular ejection fraction, by visual estimation, is 30 to  35%. The left ventricle has severely decreased function. There is mildly  increased left ventricular hypertrophy.  2. Left ventricular diastolic function could not be evaluated.  3. The left ventricle demonstrates global hypokinesis.  4. Global right ventricle has mildly reduced systolic function.The right  ventricular size is normal. No increase in right ventricular wall  thickness.  5. Left atrial size was moderately dilated.  6. Right atrial size was moderately dilated.  7. The mitral valve is abnormal. Mild mitral valve regurgitation.  8. The tricuspid valve is grossly normal.  9. The aortic valve was not well visualized. Aortic valve regurgitation  is not visualized.  10. The pulmonic valve was grossly normal. Pulmonic valve regurgitation is  trivial.  11. Moderately elevated pulmonary artery systolic pressure.  12. The inferior vena cava is dilated in size with <50% respiratory  variability, suggesting right atrial pressure of 15 mmHg.    ASSESSMENT AND PLAN:  1.  Persistent atrial fibrillation/atrial flutter: Plan for ablaiton.  [name] has presented today for surgery, with the diagnosis of atrial fibrillation.  The various methods of treatment have been discussed with the patient and family. After consideration of risks, benefits and other options for treatment, the patient has consented to  Procedure(s): Catheter ablation as a surgical intervention .  Risks include but not limited to bleeding, tamponade, heart block, stroke, damage to surrounding organs, among others. The patient's history has been reviewed, patient examined, no change in status, stable for surgery.  I have reviewed the patient's chart and labs.   Questions were answered to the patient's satisfaction.    Danny Kirst Curt Bears, MD 03/13/2020 8:09 AM

## 2020-03-13 NOTE — Anesthesia Procedure Notes (Addendum)
Procedure Name: Intubation Date/Time: 03/13/2020 8:37 AM Performed by: Shirlyn Goltz, CRNA Pre-anesthesia Checklist: Patient identified, Emergency Drugs available, Suction available and Patient being monitored Patient Re-evaluated:Patient Re-evaluated prior to induction Oxygen Delivery Method: Circle system utilized Preoxygenation: Pre-oxygenation with 100% oxygen Induction Type: IV induction Ventilation: Mask ventilation without difficulty Laryngoscope Size: Mac and 4 Grade View: Grade II Tube type: Oral Tube size: 7.5 mm Number of attempts: 1 Airway Equipment and Method: Stylet and Oral airway Placement Confirmation: ETT inserted through vocal cords under direct vision,  positive ETCO2 and breath sounds checked- equal and bilateral Secured at: 22 cm Tube secured with: Tape Dental Injury: Teeth and Oropharynx as per pre-operative assessment  Comments: Intubation by Malachi Paradise SRNA

## 2020-03-14 MED FILL — Lidocaine HCl Local Preservative Free (PF) Inj 1%: INTRAMUSCULAR | Qty: 30 | Status: AC

## 2020-03-15 NOTE — Anesthesia Postprocedure Evaluation (Signed)
Anesthesia Post Note  Patient: Danny Mccormick  Procedure(s) Performed: ATRIAL FIBRILLATION ABLATION (N/A )     Patient location during evaluation: PACU Anesthesia Type: General Level of consciousness: awake and alert Pain management: pain level controlled Vital Signs Assessment: post-procedure vital signs reviewed and stable Respiratory status: spontaneous breathing, nonlabored ventilation, respiratory function stable and patient connected to nasal cannula oxygen Cardiovascular status: blood pressure returned to baseline and stable Postop Assessment: no apparent nausea or vomiting Anesthetic complications: no    Last Vitals:  Vitals:   03/13/20 1500 03/13/20 1530  BP: (!) 165/78 (!) 173/80  Pulse: 75 70  Resp: (!) 23 17  Temp:    SpO2: 93% 95%    Last Pain:  Vitals:   03/13/20 1205  TempSrc:   PainSc: 0-No pain                 Shelton Silvas

## 2020-04-11 ENCOUNTER — Encounter (HOSPITAL_COMMUNITY): Payer: Self-pay | Admitting: Nurse Practitioner

## 2020-04-11 ENCOUNTER — Other Ambulatory Visit: Payer: Self-pay

## 2020-04-11 ENCOUNTER — Ambulatory Visit (HOSPITAL_COMMUNITY)
Admission: RE | Admit: 2020-04-11 | Discharge: 2020-04-11 | Disposition: A | Discharge status: Home / Self Care | Payer: Medicare Other | Source: Ambulatory Visit | Attending: Nurse Practitioner | Admitting: Nurse Practitioner

## 2020-04-11 VITALS — BP 164/86 | HR 67 | Ht 66.0 in | Wt 274.2 lb

## 2020-04-11 DIAGNOSIS — I5040 Unspecified combined systolic (congestive) and diastolic (congestive) heart failure: Secondary | ICD-10-CM | POA: Diagnosis not present

## 2020-04-11 DIAGNOSIS — I10 Essential (primary) hypertension: Secondary | ICD-10-CM

## 2020-04-11 DIAGNOSIS — Z7901 Long term (current) use of anticoagulants: Secondary | ICD-10-CM | POA: Insufficient documentation

## 2020-04-11 DIAGNOSIS — Z8249 Family history of ischemic heart disease and other diseases of the circulatory system: Secondary | ICD-10-CM | POA: Diagnosis not present

## 2020-04-11 DIAGNOSIS — Z79899 Other long term (current) drug therapy: Secondary | ICD-10-CM | POA: Diagnosis not present

## 2020-04-11 DIAGNOSIS — I480 Paroxysmal atrial fibrillation: Secondary | ICD-10-CM | POA: Diagnosis not present

## 2020-04-11 DIAGNOSIS — Z87891 Personal history of nicotine dependence: Secondary | ICD-10-CM | POA: Insufficient documentation

## 2020-04-11 DIAGNOSIS — M1990 Unspecified osteoarthritis, unspecified site: Secondary | ICD-10-CM | POA: Diagnosis not present

## 2020-04-11 DIAGNOSIS — I4891 Unspecified atrial fibrillation: Secondary | ICD-10-CM | POA: Insufficient documentation

## 2020-04-11 DIAGNOSIS — I110 Hypertensive heart disease with heart failure: Secondary | ICD-10-CM | POA: Insufficient documentation

## 2020-04-11 DIAGNOSIS — I4892 Unspecified atrial flutter: Secondary | ICD-10-CM | POA: Insufficient documentation

## 2020-04-11 DIAGNOSIS — D6869 Other thrombophilia: Secondary | ICD-10-CM

## 2020-04-11 MED ORDER — LOSARTAN POTASSIUM 50 MG PO TABS: ORAL_TABLET | ORAL | Status: DC

## 2020-04-11 MED ORDER — LOSARTAN POTASSIUM 100 MG PO TABS: 100.0000 mg | ORAL_TABLET | Freq: Every day | ORAL | 3 refills | Status: DC

## 2020-04-11 MED ORDER — METOPROLOL SUCCINATE ER 100 MG PO TB24: ORAL_TABLET | ORAL | Status: DC

## 2020-04-11 NOTE — Progress Notes (Signed)
Thanks

## 2020-04-11 NOTE — Progress Notes (Signed)
Primary Care Physician: Carylon Perches, MD Referring Physician: Dr. Coral Danny Mccormick is a 64 y.o. male with a h/o atrial flutter, hypertension.  He presented to his PCPs office January 2021 and was found to be in atrial flutter with heart rates in the 140s.  He went to the [facility] emergency room for further evaluation.  He had been having dyspnea on exertion for 4 weeks prior to that.   He had an echo while in the hospital that showed an ejection fraction of 30 to 35%. He had a TEE cardioversion 01/01/2020.  On 01/02/2020, he had recurrent atrial fibrillation.  He was started on amiodarone which converted him to sinus rhythm.  He presented for ablation of afib/flutter  with Dr. Elberta Fortis   03/13/20.  He has not noted any afib since procedure. Marland Kitchen He continues on amiodarone 200 mg daily. His BP remains elevated. Per pt it runs this level at home as well.  Increase losartan to 100 mg daily, he hs been taking 75 mg daily.  He is also drinking 6 cups of coffee a day, advised this may be contributing to HTN   and at least cut consumption in half.  He denies snoring, some alcohol but not more than 2-3 drinks a week per pt. He struggles with weight loss. 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 States his hip/knees hurt with walking. No swallowing or groin  issues since the procedure. Consistent with anticoagulation with a CHA2DS2VASc score of 3.    Today, he denies symptoms of palpitations, chest pain, shortness of breath, orthopnea, PND, lower extremity edema, dizziness, presyncope, syncope, or neurologic sequela. The patient is tolerating medications without difficulties and is otherwise without complaint today.   Past Medical History:  Diagnosis Date  . Arthritis   . Atrial fibrillation with RVR (HCC) 01/01/2020  . Eczema   . Hypertension    Past Surgical History:  Procedure Laterality Date  . ATRIAL FIBRILLATION ABLATION N/A 03/13/2020   Procedure: ATRIAL FIBRILLATION ABLATION;   Surgeon: Regan Lemming, MD;  Location: [facility] INVASIVE CV LAB;  Service: Cardiovascular;  Laterality: N/A;  . BUBBLE STUDY  01/01/2020   Procedure: BUBBLE STUDY;  Surgeon: Parke Poisson, MD;  Location: [facility] ENDOSCOPY;  Service: Cardiovascular;;  . CARDIOVERSION N/A 01/01/2020   Procedure: CARDIOVERSION;  Surgeon: Parke Poisson, MD;  Location: [facility] ENDOSCOPY;  Service: Cardiovascular;  Laterality: N/A;  . HAND SURGERY    . KNEE SURGERY    . TEE WITHOUT CARDIOVERSION N/A 01/01/2020   Procedure: TRANSESOPHAGEAL ECHOCARDIOGRAM (TEE);  Surgeon: Parke Poisson, MD;  Location: [facility] ENDOSCOPY;  Service: Cardiovascular;  Laterality: N/A;    Current Outpatient Medications  Medication Sig Dispense Refill  . acetaminophen (TYLENOL) 500 MG tablet Take 500-1,000 mg by mouth every 6 (six) hours as needed for moderate pain.    Marland Kitchen amiodarone (PACERONE) 200 MG tablet Take 1 tablet (200 mg total) by mouth daily. 30 tablet 11  . amLODipine (NORVASC) 5 MG tablet Take 5 mg by mouth daily.    Marland Kitchen apixaban (ELIQUIS) 5 MG TABS tablet Take 1 tablet (5 mg total) by mouth 2 (two) times daily. 60 tablet 6  . Apremilast (OTEZLA) 30 MG TABS Take 30 mg by mouth 2 (two) times daily.    . furosemide (LASIX) 40 MG tablet Take 1 tablet (40 mg total) by mouth daily. 30 tablet 3  . losartan (COZAAR) 50 MG tablet Take 1 tablet (50 mg  total) by mouth daily. (Patient taking differently: Take 50 mg by mouth daily. Taking 75mg  by mouth daily) 90 tablet 3  . metoprolol succinate (TOPROL-XL) 100 MG 24 hr tablet Take 1 tablet (100 mg total) by mouth daily. Take with or immediately following a meal. DECREASE TO 50MG  IF HEARTRATE BELOW 55 (Patient taking differently: Take 50 mg by mouth daily. Take with or immediately following a meal. DECREASE TO 50MG  IF HEARTRATE BELOW 55) 90 tablet 0  . nitroGLYCERIN (NITROSTAT) 0.4 MG SL tablet Place 1 tablet (0.4 mg total) under the tongue every 5 (five) minutes as needed for chest pain. 90 tablet 3    No current facility-administered medications for this encounter.    No Known Allergies  Social History   Socioeconomic History  . Marital status: Married    Spouse name: Not on file  . Number of children: Not on file  . Years of education: Not on file  . Highest education level: Not on file  Occupational History  . Not on file  Tobacco Use  . Smoking status: Former  . Smokeless tobacco: Never Used  Substance and Sexual Activity  . Alcohol use: Yes    Comment: occ  . Drug use: Never  . Sexual activity: Not on file  Other Topics Concern  . Not on file  Social History Narrative  . Not on file   Social Determinants of Health   Financial Resource Strain:   . Difficulty of Paying Living Expenses:   Food Insecurity:   . Worried About in the Last Year:   . in the Last Year:   Transportation Needs:   . Games developer (Medical):   Programme researcher, broadcasting/film/video Lack of Transportation (Non-Medical):   Physical Activity:   . Days of Exercise per Week:   . Minutes of Exercise per Session:   Stress:   . Feeling of Stress :   Social Connections:   . Frequency of Communication with Friends and Family:   . Frequency of Social Gatherings with Friends and Family:   . Attends Religious Services:   . Active Member of Clubs or Organizations:   . Attends Barista Meetings:   Freight forwarder Marital Status:   Intimate Partner Violence:   . Fear of Current or Ex-Partner:   . Emotionally Abused:   Marland Kitchen Physically Abused:   . Sexually Abused:     Family History  Problem Relation Age of Onset  . Atrial fibrillation Brother     ROS- All systems are reviewed and negative except as per the HPI above  Physical Exam: Vitals:   04/11/20 1331  Weight: 124.4 kg  Height: 5\' 6"  (1.676 m)   Wt Readings from Last 3 Encounters:  04/11/20 124.4 kg  03/13/20 117.9 kg  02/16/20 121.6 kg    Labs: Lab Results  Component Value Date   NA 142 03/06/2020   K 5.0  03/06/2020   CL 105 03/06/2020   CO2 22 03/06/2020   GLUCOSE 88 03/06/2020   BUN 25 03/06/2020   CREATININE 1.21 03/06/2020   CALCIUM 9.3 03/06/2020   MG 2.0 01/01/2020   No results found for: INR No results found for: CHOL, HDL, LDLCALC, TRIG   GEN- The patient is well appearing, alert and oriented x 3 today.   Head- normocephalic, atraumatic Eyes-  Sclera clear, conjunctiva pink Ears- hearing intact Oropharynx- clear Neck- supple, no JVP Lymph- no cervical lymphadenopathy Lungs- Clear to ausculation bilaterally, normal  work of breathing Heart- Regular rate and rhythm, no murmurs, rubs or gallops, PMI not laterally displaced GI- soft, NT, ND, + BS Extremities- no clubbing, cyanosis, or edema MS- no significant deformity or atrophy Skin- no rash or lesion Psych- euthymic mood, full affect Neuro- strength and sensation are intact  EKG-NSR at 67 bpm, pr int 194 ms, qrs int 87 ms, qtc 488      Assessment and Plan: 1. Afib/flutter  S/p ablation 01/19/20 Is doing well maintaining  SR Continue amiodarone 200 mg daily  Continue metoprolol 100 mg daily   2. HTN Poorly controlled  Increase losartan to 100 mg daily from 75 mg daily  May need to increase amlodipine on future visits  Decrease caffeine intake Avoid salt in the diet  Regular exercise and weight loss recommended   3. CHA2DS2VASc score of 3 Continue eliquis 5 mg bid without interruption   4. Combined systolic/diatolic HF Continue lasix   Has f/u with Dr. Loletha Grayer June 16th He will be due to see Dr. Curt Bears in June as well for f/u ablation  Butch Penny C. Evonne Rinks, [facility] [address] [place], [place] [address] [phone]

## 2020-04-17 ENCOUNTER — Other Ambulatory Visit: Payer: Self-pay

## 2020-04-17 ENCOUNTER — Ambulatory Visit (HOSPITAL_COMMUNITY): Payer: Medicare Other | Attending: Cardiology

## 2020-04-17 DIAGNOSIS — I5022 Chronic systolic (congestive) heart failure: Secondary | ICD-10-CM

## 2020-05-25 IMAGING — CT CT ANGIO CHEST
2 of 6 series · 18 of 46 positions shown · IV contrast (Omnipaque or Isovue)
Comparison: None.

CLINICAL DATA: Elevated D-dimer. Short of breath.

EXAM:
CT ANGIOGRAPHY CHEST WITH CONTRAST
TECHNIQUE: Multidetector CT imaging of the chest was performed using the
standard protocol during bolus administration of intravenous
contrast. Multiplanar CT image reconstructions and MIPs were
obtained to evaluate the vascular anatomy.
CONTRAST:  100mL OMNIPAQUE IOHEXOL 350 MG/ML SOLN

[Series 5: pe axial thins · axial · 0.89mm/px · z∈[+1336,+1624]mm · 15 of 316 slices shown]
[im 14/316  lung]
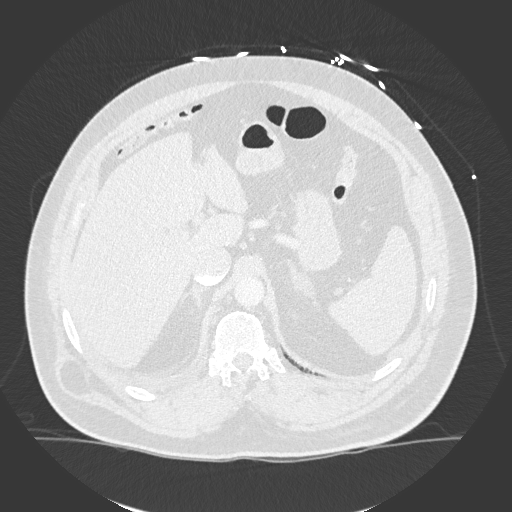
[im 42/316  soft-tissue]
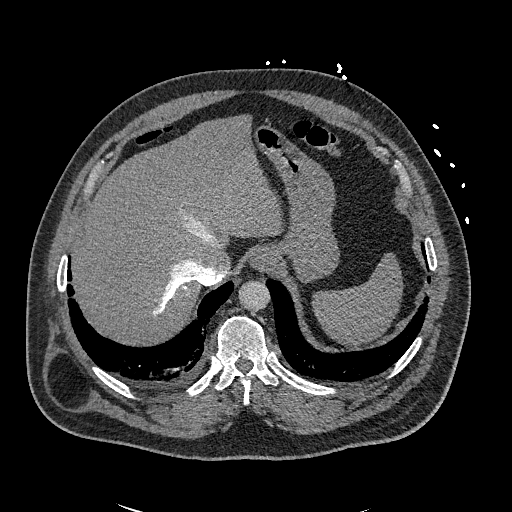
[im 55/316  lung]
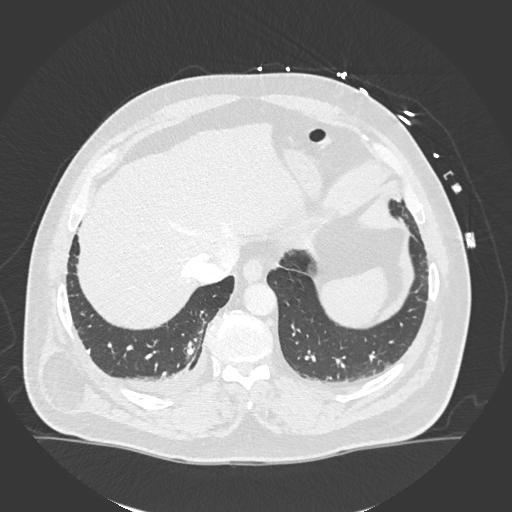
[im 83/316  soft-tissue]
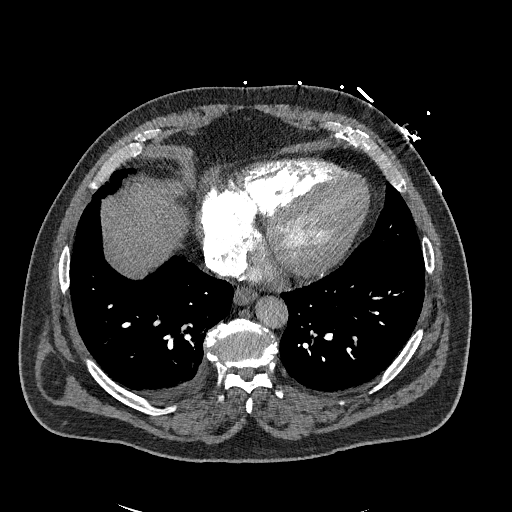
[im 96/316  lung]
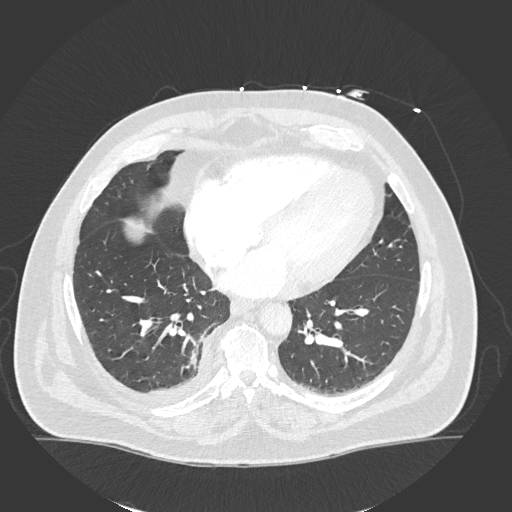
[im 124/316  soft-tissue]
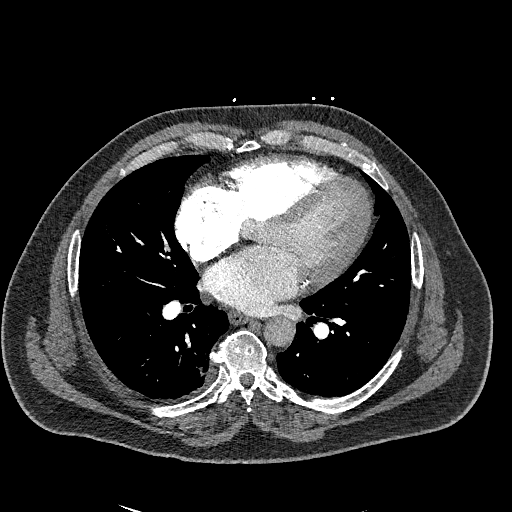
[im 137/316  lung]
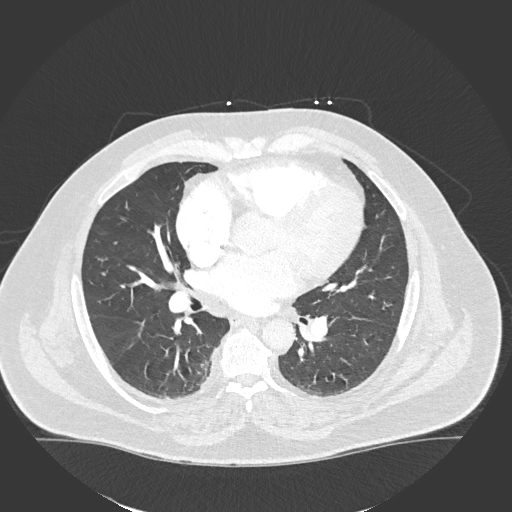
[im 165/316  soft-tissue]
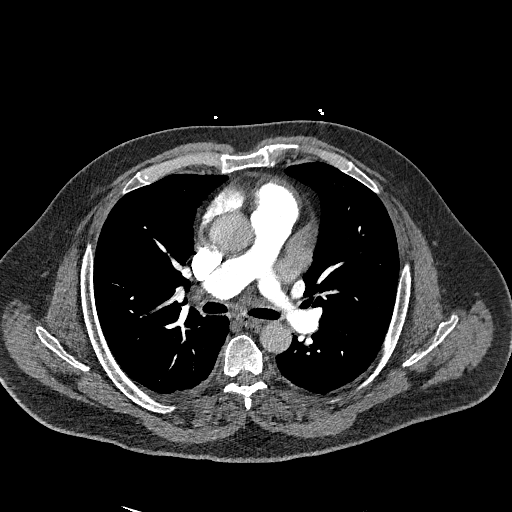
[im 179/316  lung]
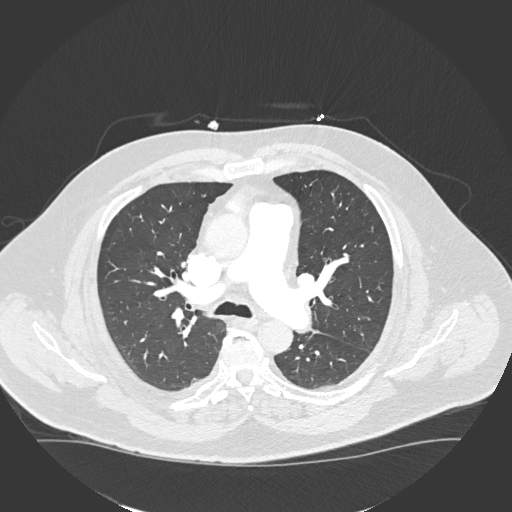
[im 192/316  soft-tissue]
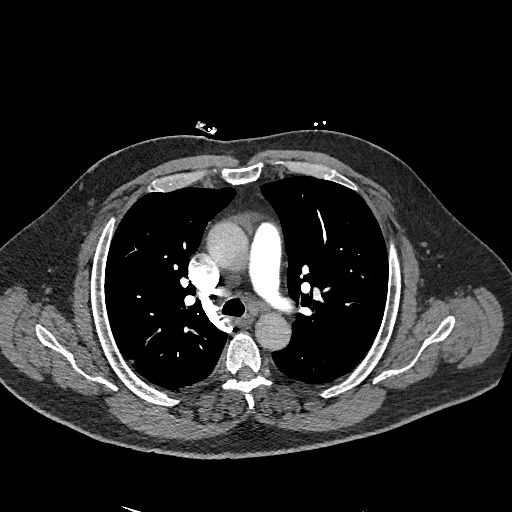
[im 220/316  lung]
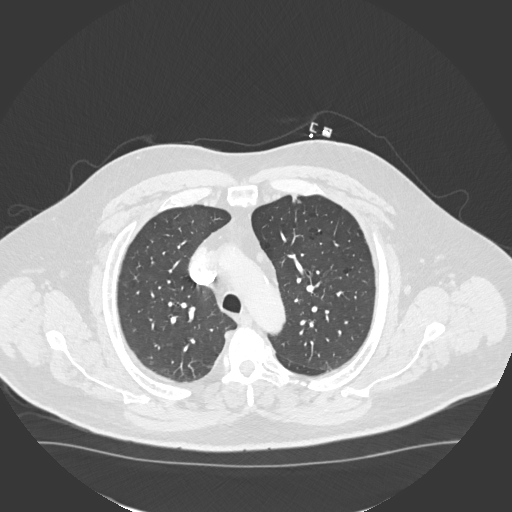
[im 233/316  soft-tissue]
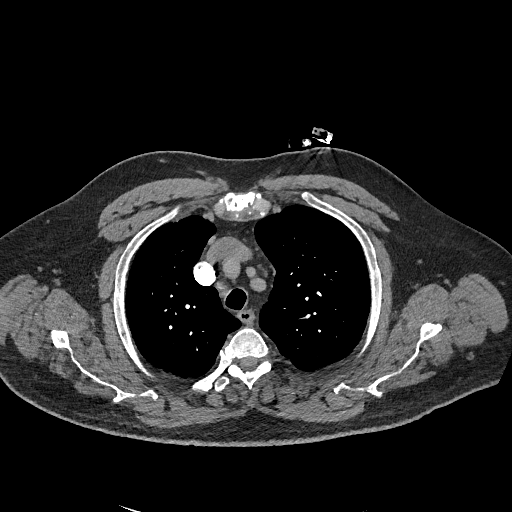
[im 261/316  lung]
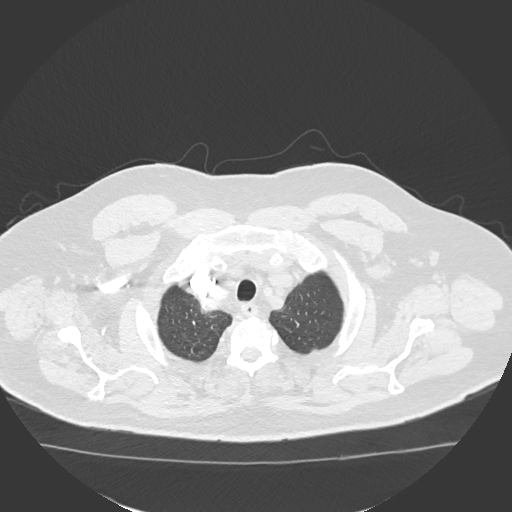
[im 274/316  soft-tissue]
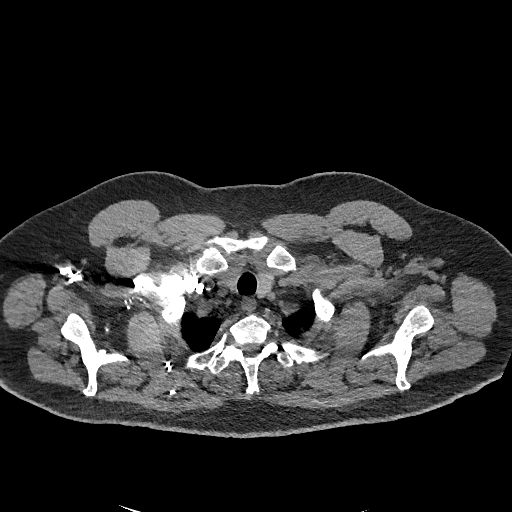
[im 302/316  lung]
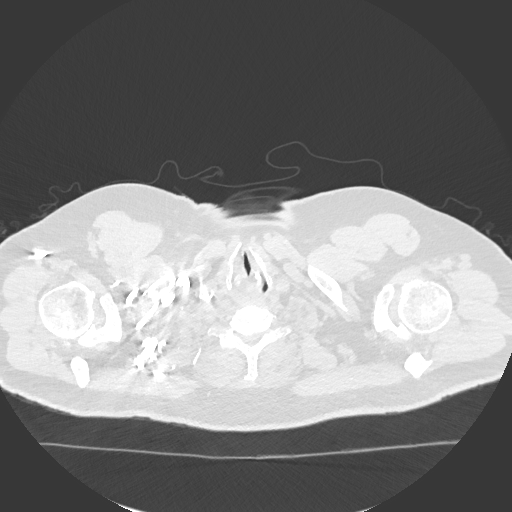

[Series 7: cor soft · coronal · 0.64mm/px · 3 of 182 slices shown]
[im 46/182  soft-tissue]
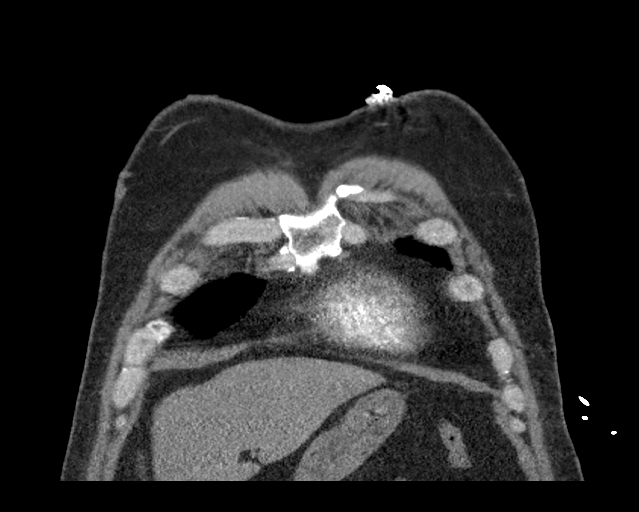
[im 91/182  soft-tissue]
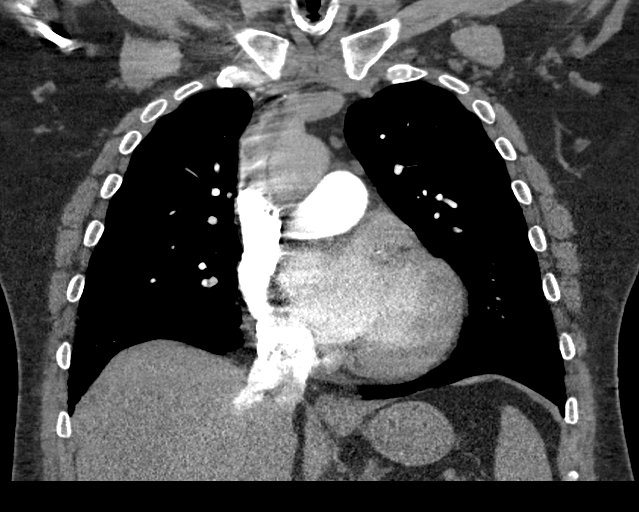
[im 136/182  soft-tissue]
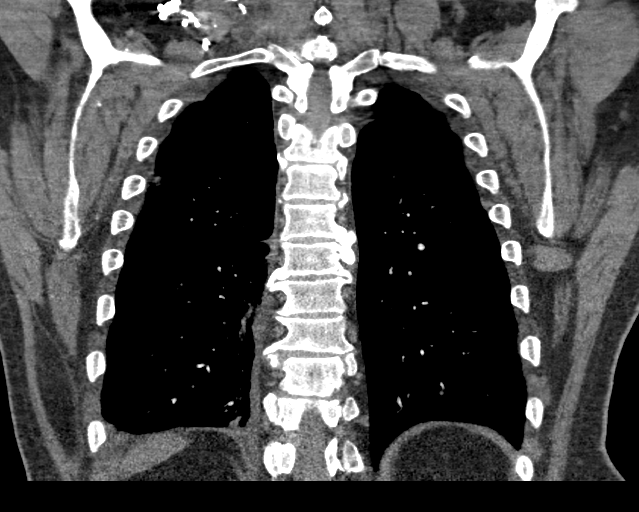

[18 of 46 positions shown; findings below may reference images not displayed]

FINDINGS: Cardiovascular: No filling defects within the pulmonary arteries
arteries to suggest acute pulmonary embolism. Coronary artery
calcification and aortic atherosclerotic calcification.

Mediastinum/Nodes: No axillary or supraclavicular adenopathy. No
mediastinal hilar adenopathy. No pericardial effusion

Lungs/Pleura: No pulmonary infarction. No infiltrate. No pulmonary
edema. Subpleural focus of ground-glass density and nodularity in
the peripheral RIGHT upper lobe on image 62/2 measures 1 cm. This
appears to be a small be a small focus of infection inflammation.
Mild basilar atelectasis on the RIGHT and small effusion.

Upper Abdomen: Limited view of the liver, kidneys, pancreas are
unremarkable. Normal adrenal glands.

Musculoskeletal: No aggressive osseous lesion.

Review of the MIP images confirms the above findings.
IMPRESSION: 1. No evidence acute pulmonary embolism.
2. Small focus of infection inflammation in the RIGHT upper lobe.
3. Small RIGHT effusion.
4. Coronary artery calcification and aortic atherosclerotic
calcification.

## 2020-05-29 ENCOUNTER — Other Ambulatory Visit: Payer: Self-pay

## 2020-05-29 ENCOUNTER — Ambulatory Visit (INDEPENDENT_AMBULATORY_CARE_PROVIDER_SITE_OTHER): Payer: Medicare Other | Admitting: Cardiovascular Disease

## 2020-05-29 VITALS — BP 130/82 | HR 58 | Ht 66.0 in | Wt 278.0 lb

## 2020-05-29 DIAGNOSIS — I43 Cardiomyopathy in diseases classified elsewhere: Secondary | ICD-10-CM

## 2020-05-29 DIAGNOSIS — I480 Paroxysmal atrial fibrillation: Secondary | ICD-10-CM | POA: Diagnosis not present

## 2020-05-29 DIAGNOSIS — I10 Essential (primary) hypertension: Secondary | ICD-10-CM | POA: Diagnosis not present

## 2020-05-29 DIAGNOSIS — R000 Tachycardia, unspecified: Secondary | ICD-10-CM

## 2020-05-29 DIAGNOSIS — I2510 Atherosclerotic heart disease of native coronary artery without angina pectoris: Secondary | ICD-10-CM | POA: Diagnosis not present

## 2020-05-29 DIAGNOSIS — R931 Abnormal findings on diagnostic imaging of heart and coronary circulation: Secondary | ICD-10-CM

## 2020-05-29 NOTE — Patient Instructions (Signed)
Medication Instructions:  STOP the Amiodarone STOP the Furosemide  *If you need a refill on your cardiac medications before your next appointment, please call your pharmacy*   Lab Work: None ordered If you have labs (blood work) drawn today and your tests are completely normal, you will receive your results only by: Marland Kitchen MyChart Message (if you have MyChart) OR . A paper copy in the mail If you have any lab test that is abnormal or we need to change your treatment, we will call you to review the results.   Testing/Procedures: None ordered   Follow-Up: At [facility], you and your health needs are our priority.  As part of our continuing mission to provide you with exceptional heart care, we have created designated Provider Care Teams.  These Care Teams include your primary Cardiologist (physician) and Advanced Practice Providers (APPs -  Physician Assistants and Nurse Practitioners) who all work together to provide you with the care you need, when you need it.  We recommend signing up for the patient portal called "MyChart".  Sign up information is provided on this After Visit Summary.  MyChart is used to connect with patients for Virtual Visits (Telemedicine).  Patients are able to view lab/test results, encounter notes, upcoming appointments, etc.  Non-urgent messages can be sent to your provider as well.   To learn more about what you can do with MyChart, go to [email or web address].    Your next appointment:   3 months with Dr. Elberta Fortis 6 months with Dr. Royann Shivers

## 2020-05-29 NOTE — Progress Notes (Signed)
Cardiology Office Note:    Date:  05/31/2020   ID:  Danny Mccormick, DOB 07-06-1956, MRN [id number]  PCP:  Asencion Noble, MD  Cardiologist:  Sanda Klein, MD  Electrophysiologist:  Constance Haw, MD   Referring MD: Asencion Noble, MD   Chief Complaint  Patient presents with  . Atrial Fibrillation    History of Present Illness:    Danny Mccormick is a 64 y.o. male with atrial flutter and atrial fibrillation, presenting with severe cardiomyopathy (EF 30-35%) in January 2021, now maintaining sinus rhythm following ablation by Dr. Curt Bears in March 2021.  Left ventricular systolic function has returned to normal.  He has not had any palpitations (he was never aware of them in the first place), but his complaints of dyspnea have completely resolved.  Follow-up echocardiography 04/17/2020 has shown complete normalization of left ventricular systolic function.  He is on ARB and beta-blocker therapy.  He is still taking furosemide and he is still taking amiodarone.  He is compliant with anticoagulation with Eliquis.  He denies any bleeding problems, falls or injuries.  He does not have edema, orthopnea or PND and denies exertional angina or dyspnea.  He has not had dizziness, syncope and as mentioned never had palpitations.  He is very frustrated that despite his best efforts he cannot lose weight.  He is exercising and is trying to eat a healthy diet, although he admits to a weakness for bread.  He is trying to eat only whole-wheat bread.  His coronary CT was performed without the use of nitroglycerin and was deemed "insufficient for plaque evaluation".  The calcium score was 1430, 95th percentile for age and gender.  Past Medical History:  Diagnosis Date  . Arthritis   . Atrial fibrillation with RVR ([place]) 01/01/2020  . Eczema   . Hypertension     Past Surgical History:  Procedure Laterality Date  . ATRIAL FIBRILLATION ABLATION N/A 03/13/2020   Procedure: ATRIAL FIBRILLATION ABLATION;   Surgeon: Constance Haw, MD;  Location: [place] CV LAB;  Service: Cardiovascular;  Laterality: N/A;  . BUBBLE STUDY  01/01/2020   Procedure: BUBBLE STUDY;  Surgeon: Elouise Munroe, MD;  Location: [place];  Service: Cardiovascular;;  . CARDIOVERSION N/A 01/01/2020   Procedure: CARDIOVERSION;  Surgeon: Elouise Munroe, MD;  Location: [facility] ENDOSCOPY;  Service: Cardiovascular;  Laterality: N/A;  . HAND SURGERY    . KNEE SURGERY    . TEE WITHOUT CARDIOVERSION N/A 01/01/2020   Procedure: TRANSESOPHAGEAL ECHOCARDIOGRAM (TEE);  Surgeon: Elouise Munroe, MD;  Location: [facility] ENDOSCOPY;  Service: Cardiovascular;  Laterality: N/A;    Current Medications: Current Meds  Medication Sig  . acetaminophen (TYLENOL) 500 MG tablet Take 500-1,000 mg by mouth every 6 (six) hours as needed for moderate pain.  Marland Kitchen amLODipine (NORVASC) 5 MG tablet Take 125 mg by mouth.   Marland Kitchen apixaban (ELIQUIS) 5 MG TABS tablet Take 1 tablet (5 mg total) by mouth 2 (two) times daily.  Marland Kitchen Apremilast (OTEZLA) 30 MG TABS Take 30 mg by mouth 2 (two) times daily.  Marland Kitchen losartan (COZAAR) 100 MG tablet Take 1 tablet (100 mg total) by mouth daily.  . metoprolol succinate (TOPROL-XL) 100 MG 24 hr tablet Taking 1/2 tablet by mouth daily with or immediately following a meal. DECREASE TO 50MG  IF HEARTRATE BELOW 55  . [DISCONTINUED] amiodarone (PACERONE) 200 MG tablet Take 1 tablet (200 mg total) by mouth daily. (Patient taking differently: Take 200 mg by mouth daily. 1/2 a  day)  . [DISCONTINUED] furosemide (LASIX) 40 MG tablet Take 1 tablet (40 mg total) by mouth daily.     Allergies:   Patient has no known allergies.   Social History   Socioeconomic History  . Marital status: Married    Spouse name: Not on file  . Number of children: Not on file  . Years of education: Not on file  . Highest education level: Not on file  Occupational History  . Not on file  Tobacco Use  . Smoking status: Former Games developer  . Smokeless tobacco:  Never Used  Vaping Use  . Vaping Use: Never used  Substance and Sexual Activity  . Alcohol use: Yes    Comment: occ  . Drug use: Never  . Sexual activity: Not on file  Other Topics Concern  . Not on file  Social History Narrative  . Not on file   Social Determinants of Health   Financial Resource Strain:   . Difficulty of Paying Living Expenses:   Food Insecurity:   . Worried About Programme researcher, broadcasting/film/video in the Last Year:   . Barista in the Last Year:   Transportation Needs:   . Freight forwarder (Medical):   Marland Kitchen Lack of Transportation (Non-Medical):   Physical Activity:   . Days of Exercise per Week:   . Minutes of Exercise per Session:   Stress:   . Feeling of Stress :   Social Connections:   . Frequency of Communication with Friends and Family:   . Frequency of Social Gatherings with Friends and Family:   . Attends Religious Services:   . Active Member of Clubs or Organizations:   . Attends Banker Meetings:   Marland Kitchen Marital Status:      Family History: The patient's family history includes Atrial fibrillation in his brother.  ROS:   Please see the history of present illness.     All other systems reviewed and are negative.  EKGs/Labs/Other Studies Reviewed:    The following studies were reviewed today: Cardiac CT 03/08/2020 Ablation procedure notes 03/13/2020  EKG:  EKG is ordered today.  It shows sinus bradycardia 58 bpm and is otherwise a normal tracing.  Borderline QTC 465 ms unchanged from previous ECG.  Recent Labs: 12/29/2019: B Natriuretic Peptide 34.0 12/30/2019: ALT 38; TSH 0.790 01/01/2020: Magnesium 2.0 03/06/2020: BUN 25; Creatinine, Ser 1.21; Hemoglobin 13.9; Platelets 255; Potassium 5.0; Sodium 142  Recent Lipid Panel No results found for: CHOL, TRIG, HDL, CHOLHDL, VLDL, LDLCALC, LDLDIRECT  Physical Exam:    VS:  BP 130/82 (BP Location: Right Arm, Patient Position: Sitting, Cuff Size: Large)   Pulse (!) 58   Ht 5\' 6"  (1.676  m)   Wt 278 lb (126.1 kg)   BMI 44.87 kg/m     Wt Readings from Last 3 Encounters:  05/29/20 278 lb (126.1 kg)  04/11/20 274 lb 3.2 oz (124.4 kg)  03/13/20 260 lb (117.9 kg)     GEN: Morbidly obese, but also strongly built and muscular, well nourished, well developed in no acute distress HEENT: Normal NECK: No JVD; No carotid bruits LYMPHATICS: No lymphadenopathy CARDIAC: RRR, no murmurs, rubs, gallops RESPIRATORY:  Clear to auscultation without rales, wheezing or rhonchi  ABDOMEN: Soft, non-tender, non-distended MUSCULOSKELETAL:  No edema; No deformity  SKIN: Warm and dry NEUROLOGIC:  Alert and oriented x 3 PSYCHIATRIC:  Normal affect   ASSESSMENT:    1. Essential hypertension   2. Tachycardia induced cardiomyopathy (HCC)  3. Paroxysmal atrial fibrillation (HCC)   4. Morbid obesity (HCC)   5. Agatston coronary artery calcium score greater than 400    PLAN:    In order of problems listed above:  1. CHF: Cardiomyopathy has resolved quickly following restoration of normal rhythm on treatment with ARB and beta-blocker.  This is consistent with tachycardia related cardiomyopathy.  Sherryll Burger was stopped due to hyperkalemia.  We will try to wean him off the diuretic.  I asked him to take it every other day for about a week and then stop it altogether, letting us know if this is associated with shortness of breath or edema. 2. AFib/AFlutter: Maintaining sinus rhythm since the ablation procedure.  We will try to stop his amiodarone due to its long-term risk of side effects.  If he does have arrhythmia recurrence that requires antiarrhythmic therapy, it may be beneficial to try an alternative agent such as dofetilide or Multaq with a lower risk profile.  He does not have angina pectoris, but does have a very high calcium score.  Probably best to avoid class I antiarrhythmic agents. 3. HTN: Well-controlled. 4. Morbid obesity: He does not have typical symptoms of obstructive sleep apnea.   Reviewed the linear relationship between weight and arrhythmia burden.  It really sounds like he is trying hard to lose weight.  He is definitely exercising.  Discussed his efforts to lose weight in detail today.  I asked him to focus on limiting the intake of carbs and we discussed the concept of the glycemic index. 5. Elevated calcium score: He has not had angina pectoris, has excellent functional capacity and has quickly regained normal left ventricular function with restoration of normal rhythm.  Unfortunately his cardiac CT was nondiagnostic.  With a calcium score of 1430, even a repeat coronary CT angiogram with careful administration of nitroglycerin may be nondiagnostic.  After the amiodarone "washes out" we can consider doing a treadmill stress test for functional evaluation.  In the long run, the focus will be on risk factor modification.  Need to get an updated lipid profile.   Medication Adjustments/Labs and Tests Ordered: Current medicines are reviewed at length with the patient today.  Concerns regarding medicines are outlined above.  Orders Placed This Encounter  Procedures  . EKG 12-Lead   No orders of the defined types were placed in this encounter.   Patient Instructions  Medication Instructions:  STOP the Amiodarone STOP the Furosemide  *If you need a refill on your cardiac medications before your next appointment, please call your pharmacy*   Lab Work: None ordered If you have labs (blood work) drawn today and your tests are completely normal, you will receive your results only by: Marland Kitchen MyChart Message (if you have MyChart) OR . A paper copy in the mail If you have any lab test that is abnormal or we need to change your treatment, we will call you to review the results.   Testing/Procedures: None ordered   Follow-Up: At [facility], you and your health needs are our priority.  As part of our continuing mission to provide you with exceptional heart care, we have  created designated Provider Care Teams.  These Care Teams include your primary Cardiologist (physician) and Advanced Practice Providers (APPs -  Physician Assistants and Nurse Practitioners) who all work together to provide you with the care you need, when you need it.  We recommend signing up for the patient portal called "MyChart".  Sign up information is provided on this  After Visit Summary.  MyChart is used to connect with patients for Virtual Visits (Telemedicine).  Patients are able to view lab/test results, encounter notes, upcoming appointments, etc.  Non-urgent messages can be sent to your provider as well.   To learn more about what you can do with MyChart, go to [email or web address].    Your next appointment:   3 months with Dr. Elberta Fortis 6 months with Dr. Royann Shivers    Signed, Thurmon Fair, MD  05/31/2020 7:01 PM    [facility]

## 2020-05-31 ENCOUNTER — Encounter: Payer: Self-pay | Admitting: Cardiovascular Disease

## 2020-06-04 ENCOUNTER — Telehealth: Payer: Self-pay | Admitting: *Deleted

## 2020-06-04 DIAGNOSIS — I2510 Atherosclerotic heart disease of native coronary artery without angina pectoris: Secondary | ICD-10-CM

## 2020-06-04 DIAGNOSIS — R931 Abnormal findings on diagnostic imaging of heart and coronary circulation: Secondary | ICD-10-CM

## 2020-06-04 NOTE — Telephone Encounter (Signed)
Called the patient pertaining to his calcium score level:  Coronary Arteries: Normal coronary origin. Right dominance. The study was performed without use of NTG and insufficient for plaque evaluation. Calcium score is 1430 that represents 95 percentile for Age/sex.  Per Dr. Royann Shivers the patient will need to keep his LDL level low. A fasting lipid lab has been ordered. The patient stated that he will go to a [employer] [employer] in [place] to get this done.

## 2020-06-10 ENCOUNTER — Other Ambulatory Visit (HOSPITAL_COMMUNITY): Payer: Self-pay | Admitting: *Deleted

## 2020-06-10 MED ORDER — METOPROLOL SUCCINATE ER 100 MG PO TB24
ORAL_TABLET | ORAL | 1 refills | Status: DC
Start: 2020-06-10 — End: 2020-12-19

## 2020-06-11 LAB — LIPID PANEL
Chol/HDL Ratio: 2.8 ratio (ref 0.0–5.0)
Cholesterol, Total: 187 mg/dL (ref 100–199)
HDL: 66 mg/dL (ref >39)
LDL Chol Calc (NIH): 105 mg/dL — ABNORMAL HIGH (ref 0–99)
Triglycerides: 90 mg/dL (ref 0–149)
VLDL Cholesterol Cal: 16 mg/dL (ref 5–40)

## 2020-06-12 ENCOUNTER — Telehealth: Payer: Self-pay | Admitting: Cardiovascular Disease

## 2020-06-12 DIAGNOSIS — I10 Essential (primary) hypertension: Secondary | ICD-10-CM

## 2020-06-12 DIAGNOSIS — Z79899 Other long term (current) drug therapy: Secondary | ICD-10-CM

## 2020-06-12 DIAGNOSIS — E785 Hyperlipidemia, unspecified: Secondary | ICD-10-CM

## 2020-06-12 NOTE — Telephone Encounter (Signed)
Returned the call to the patient. The wife has asked that we call back later today.   Even though the total cholesterol is not that bad and he has a pretty good HDL cholesterol, the LDL cholesterol is too high, considering the very heavy plaque build up in his coronaries (calcium score 95th percentile). Recommend starting atorvastatin 20 mg daily and a repeat lipid profile in 3 months. We are shooting for an LDL of 70 or less.

## 2020-06-12 NOTE — Telephone Encounter (Signed)
Follow Up: ° ° ° ° ° °Returning your call from yesterday, concerning his lab results. °

## 2020-06-12 NOTE — Telephone Encounter (Signed)
Attempted to reach the patient on his cell phone per his request. Left a message to call back.

## 2020-06-12 NOTE — Telephone Encounter (Signed)
Left a message for the patient to call back.  

## 2020-06-12 NOTE — Telephone Encounter (Signed)
Follow up ° ° °Patient is returning your call. Please call. ° ° ° °

## 2020-06-13 MED ORDER — ATORVASTATIN CALCIUM 20 MG PO TABS: 20.0000 mg | ORAL_TABLET | Freq: Every day | ORAL | 1 refills | Status: DC

## 2020-06-13 NOTE — Telephone Encounter (Signed)
[facility] is returning [name]'s call.

## 2020-06-13 NOTE — Telephone Encounter (Signed)
Advised patient of lab results, verbalized understanding Mailed lab orders and sent Rx to pharmacy

## 2020-07-01 ENCOUNTER — Encounter: Payer: Self-pay | Admitting: Cardiology

## 2020-07-01 ENCOUNTER — Other Ambulatory Visit: Payer: Self-pay

## 2020-07-01 ENCOUNTER — Ambulatory Visit (INDEPENDENT_AMBULATORY_CARE_PROVIDER_SITE_OTHER): Payer: Medicare Other | Admitting: Cardiology

## 2020-07-01 VITALS — BP 130/80 | HR 70 | Ht 66.0 in | Wt 283.0 lb

## 2020-07-01 DIAGNOSIS — I2510 Atherosclerotic heart disease of native coronary artery without angina pectoris: Secondary | ICD-10-CM

## 2020-07-01 DIAGNOSIS — I480 Paroxysmal atrial fibrillation: Secondary | ICD-10-CM

## 2020-07-01 NOTE — Progress Notes (Signed)
Electrophysiology Office Note   Date:  07/01/2020   ID:  Danny Mccormick, DOB 03-11-1956, MRN [id number]  PCP:  Danny Perches, MD  Cardiologist:  Danny Mccormick Primary Electrophysiologist:  Danny Lenz Jorja Loa, MD    Chief Complaint: AFlutter   History of Present Illness: Danny Mccormick is a 64 y.o. male who is being seen today for the evaluation of atrial flutter at the request of Danny Perches, MD. Presenting today for electrophysiology evaluation.  He has a history significant for atrial flutter, hypertension.  He presented to his PCPs office January 2021 and was found to be in atrial flutter with heart rates in the 140s.  He went to the [facility] emergency room for further evaluation.  He had been having dyspnea on exertion for 4 weeks but denied any chest pain, orthopnea, PND, fever, or chills.  He had an echo while in the hospital that showed an ejection fraction of 30 to 35%.  He had a TEE cardioversion 01/01/2020.  On 01/02/2020, he had recurrent atrial fibrillation.  He was started on amiodarone which converted him to sinus rhythm.  He has now status post AF ablation 03/13/2020  Today, denies symptoms of palpitations, chest pain, shortness of breath, orthopnea, PND, lower extremity edema, claudication, dizziness, presyncope, syncope, bleeding, or neurologic sequela. The patient is tolerating medications without difficulties.  Overall he is doing well.  He has no chest pain or shortness of breath.  He is able to do all of his daily activities without restriction.  His ejection fraction is fortunately improved since he has had maintenance of sinus rhythm.  He has noted no further episodes of atrial fibrillation.   Past Medical History:  Diagnosis Date  . Arthritis   . Atrial fibrillation with RVR (HCC) 01/01/2020  . Eczema   . Hypertension    Past Surgical History:  Procedure Laterality Date  . ATRIAL FIBRILLATION ABLATION N/A 03/13/2020   Procedure: ATRIAL FIBRILLATION ABLATION;   Surgeon: Regan Lemming, MD;  [facility] LAB;  Service: Cardiovascular;  Laterality: N/A;  . BUBBLE STUDY  01/01/2020   Procedure: BUBBLE STUDY;  Surgeon: Parke Poisson, MD;  Location: [facility] ENDOSCOPY;  Service: Cardiovascular;;  . CARDIOVERSION N/A 01/01/2020   Procedure: CARDIOVERSION;  Surgeon: Parke Poisson, MD;  Location: [facility] ENDOSCOPY;  Service: Cardiovascular;  Laterality: N/A;  . HAND SURGERY    . KNEE SURGERY    . TEE WITHOUT CARDIOVERSION N/A 01/01/2020   Procedure: TRANSESOPHAGEAL ECHOCARDIOGRAM (TEE);  Surgeon: Parke Poisson, MD;  Location: [facility] ENDOSCOPY;  Service: Cardiovascular;  Laterality: N/A;     Current Outpatient Medications  Medication Sig Dispense Refill  . acetaminophen (TYLENOL) 500 MG tablet Take 500-1,000 mg by mouth every 6 (six) hours as needed for moderate pain.    Marland Kitchen amLODipine (NORVASC) 5 MG tablet Take by mouth daily.     Marland Kitchen apixaban (ELIQUIS) 5 MG TABS tablet Take 1 tablet (5 mg total) by mouth 2 (two) times daily. 60 tablet 6  . Apremilast (OTEZLA) 30 MG TABS Take 30 mg by mouth 2 (two) times daily.    Marland Kitchen atorvastatin (LIPITOR) 20 MG tablet Take 1 tablet (20 mg total) by mouth daily. 90 tablet 1  . losartan (COZAAR) 100 MG tablet Take 1 tablet (100 mg total) by mouth daily. 30 tablet 3  . metoprolol succinate (TOPROL-XL) 100 MG 24 hr tablet Taking 1/2 tablet by mouth daily with or immediately following a meal. 45 tablet 1  . nitroGLYCERIN (  NITROSTAT) 0.4 MG SL tablet Place 1 tablet (0.4 mg total) under the tongue every 5 (five) minutes as needed for chest pain. 90 tablet 3   No current facility-administered medications for this visit.    Allergies:   Patient has no known allergies.   Social History:  The patient  reports that he has quit smoking. He has never used smokeless tobacco. He reports current alcohol use. He reports that he does not use drugs.   Family History:  The patient's family history includes Atrial fibrillation  in his brother.   ROS:  Please see the history of present illness.   Otherwise, review of systems is positive for none.   All other systems are reviewed and negative.   PHYSICAL EXAM: VS:  BP 130/80   Pulse 70   Ht 5\' 6"  (1.676 m)   Wt 283 lb (128.4 kg)   SpO2 95%   BMI 45.68 kg/m  , BMI Body mass index is 45.68 kg/m. GEN: Well nourished, well developed, in no acute distress  HEENT: normal  Neck: no JVD, carotid bruits, or masses Cardiac: RRR; no murmurs, rubs, or gallops,no edema  Respiratory:  clear to auscultation bilaterally, normal work of breathing GI: soft, nontender, nondistended, + BS MS: no deformity or atrophy  Skin: warm and dry Neuro:  Strength and sensation are intact Psych: euthymic mood, full affect  EKG:  EKG is ordered today. Personal review of the ekg ordered shows sinus rhythm, rate 70  Recent Labs: 12/29/2019: B Natriuretic Peptide 34.0 12/30/2019: ALT 38; TSH 0.790 01/01/2020: Magnesium 2.0 03/06/2020: BUN 25; Creatinine, Ser 1.21; Hemoglobin 13.9; Platelets 255; Potassium 5.0; Sodium 142    Lipid Panel     Component Value Date/Time   CHOL 187 06/10/2020 1158   TRIG 90 06/10/2020 1158   HDL 66 06/10/2020 1158   CHOLHDL 2.8 06/10/2020 1158   LDLCALC 105 (H) 06/10/2020 1158     Wt Readings from Last 3 Encounters:  07/01/20 283 lb (128.4 kg)  05/29/20 278 lb (126.1 kg)  04/11/20 274 lb 3.2 oz (124.4 kg)      Other studies Reviewed: Additional studies/ records that were reviewed today include: TTE 12/30/19  Review of the above records today demonstrates:  1. Left ventricular ejection fraction, by visual estimation, is 30 to  35%. The left ventricle has severely decreased function. There is mildly  increased left ventricular hypertrophy.  2. Left ventricular diastolic function could not be evaluated.  3. The left ventricle demonstrates global hypokinesis.  4. Global right ventricle has mildly reduced systolic function.The right  ventricular  size is normal. No increase in right ventricular wall  thickness.  5. Left atrial size was moderately dilated.  6. Right atrial size was moderately dilated.  7. The mitral valve is abnormal. Mild mitral valve regurgitation.  8. The tricuspid valve is grossly normal.  9. The aortic valve was not well visualized. Aortic valve regurgitation  is not visualized.  10. The pulmonic valve was grossly normal. Pulmonic valve regurgitation is  trivial.  11. Moderately elevated pulmonary artery systolic pressure.  12. The inferior vena cava is dilated in size with <50% respiratory  variability, suggesting right atrial pressure of 15 mmHg.    ASSESSMENT AND PLAN:  1.  Persistent atrial fibrillation/atrial flutter: Currently on Eliquis.  Is now status post AF ablation 03/13/2020.  CHA2DS2-VASc of 3.  He has had no further episodes of atrial fibrillation.  His amiodarone has previously been stopped.  No changes.  2.  Systolic heart failure: Unknown chronicity.  Currently on Toprol-XL and Entresto.  Repeat echo shows improvement in his ejection fraction.    3.  Hypertension: Normal today.  No changes.   Current medicines are reviewed at length with the patient today.   The patient does not have concerns regarding his medicines.  The following changes were made today: None  Labs/ tests ordered today include:  Orders Placed This Encounter  Procedures  . EKG 12-Lead     Disposition:   FU with Ankush Gintz 3 months  Signed, Ashon Rosenberg Jorja Loa, MD  07/01/2020 4:14 PM     [facility] [address] [place] [address] [phone] (office) [phone] (fax)

## 2020-07-01 NOTE — Patient Instructions (Signed)
Medication Instructions:  Your physician recommends that you continue on your current medications as directed. Please refer to the Current Medication list given to you today.  *If you need a refill on your cardiac medications before your next appointment, please call your pharmacy*   Lab Work: None ordered   Testing/Procedures: None ordered   Follow-Up: At [facility], you and your health needs are our priority.  As part of our continuing mission to provide you with exceptional heart care, we have created designated Provider Care Teams.  These Care Teams include your primary Cardiologist (physician) and Advanced Practice Providers (APPs -  Physician Assistants and Nurse Practitioners) who all work together to provide you with the care you need, when you need it.  We recommend signing up for the patient portal called "MyChart".  Sign up information is provided on this After Visit Summary.  MyChart is used to connect with patients for Virtual Visits (Telemedicine).  Patients are able to view lab/test results, encounter notes, upcoming appointments, etc.  Non-urgent messages can be sent to your provider as well.   To learn more about what you can do with MyChart, go to [email or web address].    Your next appointment:   3 month(s)  The format for your next appointment:   In Person  Provider:   Will Camnitz, MD    Thank you for choosing [facility]!!   Shuaib Corsino, RN [phone]   Other Instructions    

## 2020-07-29 ENCOUNTER — Other Ambulatory Visit (HOSPITAL_COMMUNITY): Payer: Self-pay | Admitting: Nurse Practitioner

## 2020-08-23 ENCOUNTER — Other Ambulatory Visit (HOSPITAL_COMMUNITY): Payer: Self-pay | Admitting: *Deleted

## 2020-08-23 MED ORDER — LOSARTAN POTASSIUM 100 MG PO TABS: ORAL_TABLET | ORAL | 3 refills | Status: DC

## 2020-09-06 ENCOUNTER — Ambulatory Visit: Payer: Medicare Other | Admitting: Cardiology

## 2020-09-13 ENCOUNTER — Other Ambulatory Visit: Payer: Self-pay | Admitting: *Deleted

## 2020-09-13 LAB — HEPATIC FUNCTION PANEL
ALT: 22 IU/L (ref 0–44)
AST: 21 IU/L (ref 0–40)
Albumin: 4.5 g/dL (ref 3.8–4.8)
Alkaline Phosphatase: 66 IU/L (ref 44–121)
Bilirubin Total: 0.4 mg/dL (ref 0.0–1.2)
Bilirubin, Direct: 0.13 mg/dL (ref 0.00–0.40)
Total Protein: 6.8 g/dL (ref 6.0–8.5)

## 2020-09-13 LAB — LIPID PANEL
Chol/HDL Ratio: 2.7 ratio (ref 0.0–5.0)
Cholesterol, Total: 152 mg/dL (ref 100–199)
HDL: 57 mg/dL (ref >39)
LDL Chol Calc (NIH): 78 mg/dL (ref 0–99)
Triglycerides: 94 mg/dL (ref 0–149)
VLDL Cholesterol Cal: 17 mg/dL (ref 5–40)

## 2020-09-13 MED ORDER — ATORVASTATIN CALCIUM 40 MG PO TABS: 40.0000 mg | ORAL_TABLET | Freq: Every day | ORAL | 3 refills | Status: DC

## 2020-09-16 ENCOUNTER — Other Ambulatory Visit: Payer: Self-pay | Admitting: Cardiology

## 2020-09-16 NOTE — Telephone Encounter (Signed)
Eliquis 5mg  refill request received. Patient is 64 years old, weight-128.4kg, Crea-1.21 on 03/06/2020, Diagnosis-Afib, and last seen by Dr. 03/08/2020 on 07/01/2020. Dose is appropriate based on dosing criteria. Will send in refill to requested pharmacy.

## 2020-09-17 MED ORDER — AMLODIPINE BESYLATE 5 MG PO TABS: 5.0000 mg | ORAL_TABLET | Freq: Every day | ORAL | 2 refills | Status: DC

## 2020-09-25 ENCOUNTER — Ambulatory Visit: Payer: Medicare Other

## 2020-10-01 ENCOUNTER — Encounter: Payer: Self-pay | Admitting: Cardiology

## 2020-10-01 ENCOUNTER — Ambulatory Visit (INDEPENDENT_AMBULATORY_CARE_PROVIDER_SITE_OTHER): Payer: Medicare Other | Admitting: Cardiology

## 2020-10-01 ENCOUNTER — Other Ambulatory Visit: Payer: Self-pay

## 2020-10-01 VITALS — BP 154/90 | HR 58 | Ht 66.0 in | Wt 284.0 lb

## 2020-10-01 DIAGNOSIS — I4819 Other persistent atrial fibrillation: Secondary | ICD-10-CM | POA: Diagnosis not present

## 2020-10-01 DIAGNOSIS — I2510 Atherosclerotic heart disease of native coronary artery without angina pectoris: Secondary | ICD-10-CM | POA: Diagnosis not present

## 2020-10-01 NOTE — Progress Notes (Signed)
Electrophysiology Office Note   Date:  10/01/2020   ID:  Danny, Mccormick 07/31/1956, MRN [id number]  PCP:  Danny Perches, MD  Cardiologist:  Danny Mccormick Primary Electrophysiologist:  Danny Larkin Jorja Loa, MD    Chief Complaint: AFlutter   History of Present Illness: Danny Mccormick is a 64 y.o. male who is being seen today for the evaluation of atrial flutter at the request of Danny Perches, MD. Presenting today for electrophysiology evaluation.  He has a history of atrial flutter, atrial fibrillation, and hypertension.  He presented to his PCPs office January 2021 and was found to be in atrial flutter with rates in the 140s.  He went to [facility] emergency room for further evaluation.  He had been having 4 weeks of dyspnea on exertion.  His echocardiogram showed an ejection fraction of 30 to 35%.  He underwent cardioversion 01/01/2020.  On 01/02/2020 he had recurrent atrial fibrillation was started on amiodarone and converted to sinus rhythm.  He is now status post AF ablation 03/13/2020.  Amiodarone has been stopped.  Today, denies symptoms of palpitations, chest pain, shortness of breath, orthopnea, PND, lower extremity edema, claudication, dizziness, presyncope, syncope, bleeding, or neurologic sequela. The patient is tolerating medications without difficulties.  He is having no further episodes of atrial fibrillation.  He currently feels well and is able to do all daily activities.  He has noted that his blood pressure is mildly elevated at home.  He has not had any symptoms from this.  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   Past Medical History:  Diagnosis Date  . Arthritis   . Atrial fibrillation with RVR (HCC) 01/01/2020  . Eczema   . Hypertension    Past Surgical History:  Procedure Laterality Date  . ATRIAL FIBRILLATION ABLATION N/A 03/13/2020   Procedure: ATRIAL FIBRILLATION ABLATION;  Surgeon: Danny Lemming, MD;  Location: [facility] INVASIVE CV LAB;  Service:  Cardiovascular;  Laterality: N/A;  . BUBBLE STUDY  01/01/2020   Procedure: BUBBLE STUDY;  Surgeon: Danny Poisson, MD;  Location: [facility] ENDOSCOPY;  Service: Cardiovascular;;  . CARDIOVERSION N/A 01/01/2020   Procedure: CARDIOVERSION;  Surgeon: Danny Poisson, MD;  Location: [facility] ENDOSCOPY;  Service: Cardiovascular;  Laterality: N/A;  . HAND SURGERY    . KNEE SURGERY    . TEE WITHOUT CARDIOVERSION N/A 01/01/2020   Procedure: TRANSESOPHAGEAL ECHOCARDIOGRAM (TEE);  Surgeon: Danny Poisson, MD;  Location: [facility] ENDOSCOPY;  Service: Cardiovascular;  Laterality: N/A;     Current Outpatient Medications  Medication Sig Dispense Refill  . acetaminophen (TYLENOL) 500 MG tablet Take 500-1,000 mg by mouth every 6 (six) hours as needed for moderate pain.    Marland Kitchen amLODipine (NORVASC) 5 MG tablet Take 1 tablet (5 mg total) by mouth daily. 30 tablet 2  . Apremilast (OTEZLA) 30 MG TABS Take 30 mg by mouth 2 (two) times daily.    Marland Kitchen atorvastatin (LIPITOR) 40 MG tablet Take 1 tablet (40 mg total) by mouth daily. 90 tablet 3  . ELIQUIS 5 MG TABS tablet Take 1 tablet by mouth twice daily 60 tablet 5  . losartan (COZAAR) 100 MG tablet TAKE 1 TABLET BY MOUTH ONCE DAILY 90 tablet 3  . metoprolol succinate (TOPROL-XL) 100 MG 24 hr tablet Taking 1/2 tablet by mouth daily with or immediately following a meal. 45 tablet 1  . nitroGLYCERIN (NITROSTAT) 0.4 MG SL tablet Place 1 tablet (0.4 mg total) under the tongue every 5 (five) minutes  as needed for chest pain. 90 tablet 3   No current facility-administered medications for this visit.    Allergies:   Patient has no known allergies.   Social History:  The patient  reports that he has quit smoking. He has never used smokeless tobacco. He reports current alcohol use. He reports that he does not use drugs.   Family History:  The patient's family history includes Atrial fibrillation in his brother.   ROS:  Please see the history of present illness.   Otherwise, review  of systems is positive for none.   All other systems are reviewed and negative.   PHYSICAL EXAM: VS:  BP (!) 154/90   Pulse (!) 58   Ht 5\' 6"  (1.676 m)   Wt 284 lb (128.8 kg)   SpO2 98%   BMI 45.84 kg/m  , BMI Body mass index is 45.84 kg/m. GEN: Well nourished, well developed, in no acute distress  HEENT: normal  Neck: no JVD, carotid bruits, or masses Cardiac: RRR; no murmurs, rubs, or gallops,no edema  Respiratory:  clear to auscultation bilaterally, normal work of breathing GI: soft, nontender, nondistended, + BS MS: no deformity or atrophy  Skin: warm and dry Neuro:  Strength and sensation are intact Psych: euthymic mood, full affect  EKG:  EKG is ordered today. Personal review of the ekg ordered shows sinus rhythm, rate 70.    Recent Labs: 12/29/2019: B Natriuretic Peptide 34.0 12/30/2019: TSH 0.790 01/01/2020: Magnesium 2.0 03/06/2020: BUN 25; Creatinine, Ser 1.21; Hemoglobin 13.9; Platelets 255; Potassium 5.0; Sodium 142 09/12/2020: ALT 22    Lipid Panel     Component Value Date/Time   CHOL 152 09/12/2020 1141   TRIG 94 09/12/2020 1141   HDL 57 09/12/2020 1141   CHOLHDL 2.7 09/12/2020 1141   LDLCALC 78 09/12/2020 1141     Wt Readings from Last 3 Encounters:  10/01/20 284 lb (128.8 kg)  07/01/20 283 lb (128.4 kg)  05/29/20 278 lb (126.1 kg)      Other studies Reviewed: Additional studies/ records that were reviewed today include: TTE 12/30/19  Review of the above records today demonstrates:  1. Left ventricular ejection fraction, by visual estimation, is 30 to  35%. The left ventricle has severely decreased function. There is mildly  increased left ventricular hypertrophy.  2. Left ventricular diastolic function could not be evaluated.  3. The left ventricle demonstrates global hypokinesis.  4. Global right ventricle has mildly reduced systolic function.The right  ventricular size is normal. No increase in right ventricular wall  thickness.  5. Left  atrial size was moderately dilated.  6. Right atrial size was moderately dilated.  7. The mitral valve is abnormal. Mild mitral valve regurgitation.  8. The tricuspid valve is grossly normal.  9. The aortic valve was not well visualized. Aortic valve regurgitation  is not visualized.  10. The pulmonic valve was grossly normal. Pulmonic valve regurgitation is  trivial.  11. Moderately elevated pulmonary artery systolic pressure.  12. The inferior vena cava is dilated in size with <50% respiratory  variability, suggesting right atrial pressure of 15 mmHg.    ASSESSMENT AND PLAN:  1.  Persistent atrial fibrillation/atrial flutter: Currently on Eliquis.  Status post ablation 03/13/2020.  CHA2DS2-VASc of 3.  Was previously on amiodarone but this has been stopped.  He remains in sinus rhythm.  No changes.  2.  Systolic heart failure: Unknown chronicity.  Currently on Toprol-XL and Entresto.  Repeat echo shows improvement in his ejection  fraction.    3.  Hypertension: Mildly elevated today.  Johnluke Haugen increase Norvasc to 10 mg.   Current medicines are reviewed at length with the patient today.   The patient does not have concerns regarding his medicines.  The following changes were made today: Increase Norvasc  Labs/ tests ordered today include:  No orders of the defined types were placed in this encounter.    Disposition:   FU with Tamsen Reist 6 months  Signed, Lamiah Marmol Jorja Loa, MD  10/01/2020 10:20 AM     [facility] [address] [place] [address] [phone] (office) [phone] (fax)

## 2020-10-02 NOTE — Addendum Note (Signed)
Addended by: Dareen Piano on: 10/02/2020 08:32 AM   Modules accepted: Orders

## 2020-11-27 MED ORDER — AMLODIPINE BESYLATE 5 MG PO TABS
5.0000 mg | ORAL_TABLET | Freq: Every day | ORAL | 2 refills | Status: DC
Start: 2020-11-27 — End: 2020-11-28

## 2020-11-28 ENCOUNTER — Telehealth: Payer: Self-pay | Admitting: Cardiovascular Disease

## 2020-11-28 MED ORDER — AMLODIPINE BESYLATE 5 MG PO TABS
10.0000 mg | ORAL_TABLET | Freq: Every day | ORAL | 1 refills | Status: DC
Start: 2020-11-28 — End: 2021-06-10

## 2020-11-28 NOTE — Telephone Encounter (Signed)
     Pt c/o medication issue:  1. Name of Medication: amLODipine (NORVASC) 5 MG tablet  2. How are you currently taking this medication (dosage and times per day)? 5 mg 2x daily   3. Are you having a reaction (difficulty breathing--STAT)? no  4. What is your medication issue? Patient said the dosage was changed from 1x daily to 2x daily at his last visit. The patient said the pharmacy needs an rx with updated dosage instructions so the patient wont have insurance issues when he tries to get the medication refilled    *STAT* If patient is at the pharmacy, call can be transferred to refill team.   1. Which medications need to be refilled? (please list name of each medication and dose if known)  amLODipine (NORVASC) 5 MG tablet 2x daily   2. Which pharmacy/location (including street and city if local pharmacy) is medication to be sent to? [id number] - [place], [place] - [address]  3. Do they need a 30 day or 90 day supply? 90 with refills

## 2020-11-29 NOTE — Telephone Encounter (Signed)
Medication already refilled via MyChart encounter

## 2020-12-11 ENCOUNTER — Encounter: Payer: Self-pay | Admitting: Cardiovascular Disease

## 2020-12-11 ENCOUNTER — Other Ambulatory Visit: Payer: Self-pay

## 2020-12-11 ENCOUNTER — Ambulatory Visit (INDEPENDENT_AMBULATORY_CARE_PROVIDER_SITE_OTHER): Payer: Medicare Other | Admitting: Cardiovascular Disease

## 2020-12-11 VITALS — BP 132/79 | HR 79 | Ht 66.0 in | Wt 276.4 lb

## 2020-12-11 DIAGNOSIS — I2510 Atherosclerotic heart disease of native coronary artery without angina pectoris: Secondary | ICD-10-CM | POA: Diagnosis not present

## 2020-12-11 DIAGNOSIS — R000 Tachycardia, unspecified: Secondary | ICD-10-CM

## 2020-12-11 DIAGNOSIS — I4892 Unspecified atrial flutter: Secondary | ICD-10-CM | POA: Diagnosis not present

## 2020-12-11 DIAGNOSIS — I739 Peripheral vascular disease, unspecified: Secondary | ICD-10-CM

## 2020-12-11 DIAGNOSIS — E785 Hyperlipidemia, unspecified: Secondary | ICD-10-CM

## 2020-12-11 DIAGNOSIS — I10 Essential (primary) hypertension: Secondary | ICD-10-CM | POA: Diagnosis not present

## 2020-12-11 DIAGNOSIS — I43 Cardiomyopathy in diseases classified elsewhere: Secondary | ICD-10-CM

## 2020-12-11 DIAGNOSIS — R931 Abnormal findings on diagnostic imaging of heart and coronary circulation: Secondary | ICD-10-CM

## 2020-12-11 NOTE — Patient Instructions (Signed)
Medication Instructions:  No Changes In Medications at this time.  *If you need a refill on your cardiac medications before your next appointment, please call your pharmacy*  Testing/Procedures AORTIC ILIAC ULTRASOUND   Follow-Up: At [facility], you and your health needs are our priority.  As part of our continuing mission to provide you with exceptional heart care, we have created designated Provider Care Teams.  These Care Teams include your primary Cardiologist (physician) and Advanced Practice Providers (APPs -  Physician Assistants and Nurse Practitioners) who all work together to provide you with the care you need, when you need it.  Your next appointment:   1 year(s)  The format for your next appointment:   In Person  Provider:   Thurmon Fair, MD

## 2020-12-11 NOTE — Progress Notes (Addendum)
Cardiology Office Note:    Date:  12/11/2020   ID:  Danny Mccormick, DOB 04/18/1956, MRN [id number]  PCP:  Carylon Perches, MD  Cardiologist:  Thurmon Fair, MD  Electrophysiologist:  Regan Lemming, MD   Referring MD: Carylon Perches, MD   Chief Complaint  Patient presents with  . Claudication  . Irregular Heart Beat         History of Present Illness:    Danny Mccormick is a 64 y.o. male with atrial flutter and atrial fibrillation, presenting with severe cardiomyopathy (EF 30-35%) in January 2021, now maintaining sinus rhythm following ablation by Dr. Elberta Fortis in March 2021.    Follow-up echocardiography 04/17/2020 has shown complete normalization of left ventricular systolic function.  During preablation work-up his CT showed a severely elevated calcium score (95th percentile), but he has never had symptoms of CAD.  He has hypertension and hypercholesterolemia but does not have diabetes mellitus.  He is not a smoker.  Since last appointment amiodarone has been discontinued and he has not had arrhythmia recurrence.  He remains on anticoagulation with Eliquis without bleeding issues.  He continues to take relatively high dose of ARB and beta-blocker, as well as amlodipine for his blood pressure.   He has no cardiovascular complaints.  In fact he is able to put in a whole lot of heavy physical work such as chopping wood and carrying arm loads" without difficulty.  Just a few days ago he picked up an 80 pound calf and carried across the farm yard, without complaints of dyspnea or angina.  He has not had dizziness or syncope.  He denies leg edema,  focal neurological complaints or other cardiovascular issues.  He does complain of pain in both buttocks with more intense physical activity.  Sometimes this is present when he first gets up in the morning and loosens up as he becomes more physically active only to recur after intense physical activity.  He does not have claudication in his calves or  feet.  He is on [place] for psoriasis/psoriatic arthritis and has relatively frequent lab checks.  His lipid profile in September showed an LDL cholesterol of 78 and subsequent to that his dose of atorvastatin was increased to 40 mg daily.  His coronary CT was performed without the use of nitroglycerin and was deemed "insufficient for plaque evaluation".  The calcium score was 1430, 95th percentile for age and gender.  Past Medical History:  Diagnosis Date  . Arthritis   . Atrial fibrillation with RVR (HCC) 01/01/2020  . Eczema   . Hypertension     Past Surgical History:  Procedure Laterality Date  . ATRIAL FIBRILLATION ABLATION N/A 03/13/2020   Procedure: ATRIAL FIBRILLATION ABLATION;  Surgeon: Regan Lemming, MD;  Location: [facility] INVASIVE CV LAB;  Service: Cardiovascular;  Laterality: N/A;  . BUBBLE STUDY  01/01/2020   Procedure: BUBBLE STUDY;  Surgeon: Parke Poisson, MD;  Location[facility] ENDOSCOPY;  Service: Cardiovascular;;  . CARDIOVERSION N/A 01/01/2020   Procedure: CARDIOVERSION;  Surgeon: Parke Poisson, MD;  Location: [facility] ENDOSCOPY;  Service: Cardiovascular;  Laterality: N/A;  . HAND SURGERY    . KNEE SURGERY    . TEE WITHOUT CARDIOVERSION N/A 01/01/2020   Procedure: TRANSESOPHAGEAL ECHOCARDIOGRAM (TEE);  Surgeon: Parke Poisson, MD;  Location: [facility] ENDOSCOPY;  Service: Cardiovascular;  Laterality: N/A;    Current Medications: Current Meds  Medication Sig  . acetaminophen (TYLENOL) 500 MG tablet Take 500-1,000 mg by mouth every 6 (six) hours  as needed for moderate pain.  Marland Kitchen amLODipine (NORVASC) 5 MG tablet Take 2 tablets (10 mg total) by mouth daily.  Marland Kitchen Apremilast (OTEZLA) 30 MG TABS Take 30 mg by mouth 2 (two) times daily.  Marland Kitchen atorvastatin (LIPITOR) 40 MG tablet Take 1 tablet (40 mg total) by mouth daily.  Marland Kitchen ELIQUIS 5 MG TABS tablet Take 1 tablet by mouth twice daily  . losartan (COZAAR) 100 MG tablet TAKE 1 TABLET BY MOUTH ONCE DAILY  . metoprolol succinate  (TOPROL-XL) 100 MG 24 hr tablet Taking 1/2 tablet by mouth daily with or immediately following a meal.  . nitroGLYCERIN (NITROSTAT) 0.4 MG SL tablet Place 1 tablet (0.4 mg total) under the tongue every 5 (five) minutes as needed for chest pain.     Allergies:   Patient has no known allergies.   Social History   Socioeconomic History  . Marital status: Married    Spouse name: Not on file  . Number of children: Not on file  . Years of education: Not on file  . Highest education level: Not on file  Occupational History  . Not on file  Tobacco Use  . Smoking status: Former Games developer  . Smokeless tobacco: Never Used  Vaping Use  . Vaping Use: Never used  Substance and Sexual Activity  . Alcohol use: Yes    Comment: occ  . Drug use: Never  . Sexual activity: Not on file  Other Topics Concern  . Not on file  Social History Narrative  . Not on file   Social Determinants of Health   Financial Resource Strain: Not on file  Food Insecurity: Not on file  Transportation Needs: Not on file  Physical Activity: Not on file  Stress: Not on file  Social Connections: Not on file     Family History: The patient's family history includes Atrial fibrillation in his brother.  ROS:   Please see the history of present illness.    All other systems are reviewed and are negative.   EKGs/Labs/Other Studies Reviewed:    The following studies were reviewed today: Cardiac CT 03/08/2020 Ablation procedure notes 03/13/2020  EKG:  EKG is not ordered today.  I reviewed the tracing from 10/01/2020 which shows sinus rhythm, normal tracing  Recent Labs: 12/29/2019: B Natriuretic Peptide 34.0 12/30/2019: TSH 0.790 01/01/2020: Magnesium 2.0 03/06/2020: BUN 25; Creatinine, Ser 1.21; Hemoglobin 13.9; Platelets 255; Potassium 5.0; Sodium 142 09/12/2020: ALT 22  Recent Lipid Panel    Component Value Date/Time   CHOL 152 09/12/2020 1141   TRIG 94 09/12/2020 1141   HDL 57 09/12/2020 1141   CHOLHDL 2.7  09/12/2020 1141   LDLCALC 78 09/12/2020 1141    Physical Exam:    VS:  BP 132/79   Pulse 79   Ht 5\' 6"  (1.676 m)   Wt 276 lb 6.4 oz (125.4 kg)   SpO2 98%   BMI 44.61 kg/m     Wt Readings from Last 3 Encounters:  12/11/20 276 lb 6.4 oz (125.4 kg)  10/01/20 284 lb (128.8 kg)  07/01/20 283 lb (128.4 kg)     General: Alert, oriented x3, no distress, Morbidly obese, but also muscular and fit appearing Head: no evidence of trauma, PERRL, EOMI, no exophtalmos or lid lag, no myxedema, no xanthelasma; normal ears, nose and oropharynx Neck: normal jugular venous pulsations and no hepatojugular reflux; brisk carotid pulses without delay and no carotid bruits Chest: clear to auscultation, no signs of consolidation by percussion or palpation, normal  fremitus, symmetrical and full respiratory excursions Cardiovascular: normal position and quality of the apical impulse, regular rhythm, normal first and second heart sounds, no murmurs, rubs or gallops Abdomen: no tenderness or distention, no masses by palpation, no abnormal pulsatility or arterial bruits, normal bowel sounds, no hepatosplenomegaly Extremities: no clubbing, cyanosis or edema; 2+ radial, ulnar and brachial pulses bilaterally; 2+ right femoral, posterior tibial and dorsalis pedis pulses; 2+ left femoral, posterior tibial and dorsalis pedis pulses; no subclavian or femoral bruits Neurological: grossly nonfocal Psych: Normal mood and affect   ASSESSMENT:    1. Tachycardia induced cardiomyopathy (HCC)   2. Atrial flutter with rapid ventricular response (HCC)   3. Essential hypertension   4. Morbid obesity (HCC)   5. Agatston coronary artery calcium score greater than 400   6. Hyperlipidemia with target LDL less than 70   7. Claudication [facility](HCC)    PLAN:    In order of problems listed above:  1. CHF:  His cardiomyopathy resolved rapidly with restoration of normal rhythm.  He no longer requires diuretics.  His clinical response  strongly supports tachycardia cardiomyopathy as the cause for his initial presentation with heart failure. 2. AFib/AFlutter: Excellent response to ablation.  Note that he does have a high calcium score, although he does not have established CAD it is probably wise to avoid class I antiarrhythmics should he have arrhythmia recurrence.  Could use Multaq or dofetilide or amiodarone or sotalol. 3. HTN: Well-controlled. 4. Morbid obesity: He has lost a little bit of weight but is not a long way to go.  Reviewed the importance of maintaining a low weight to avoid recurrence of atrial fibrillation.  He does not have daytime hypersomnolence or other symptoms that would suggest sleep apnea. 5. Elevated calcium score: He is capable of performing very intense physical activity without angina or dyspnea.  There is no clinical evidence of significant coronary stenoses, although he has a very high calcium score.  Although he has not had a formal assessment for CAD (coronary CT angiogram was suboptima for coronary purposes), I think the focus needs to be on secondary prevention with risk factor management, especially lipid-lowering therapy.  He is not on aspirin due to full anticoagulation.  He is on a high dose of beta-blockers. 6. Hypercholesterolemia: Target LDL less than 70.  I am confident that with her recent increased dose of atorvastatin which should be able to reach that goal.  He will have follow-up labs with his PCP. 7. Claudication: He appears to be describing buttock claudication.  However the pattern is more suggestive of neurogenic claudication, rather than vascular disease.  Since he has evidence of atherosclerosis and is a former smoker, he should have a screening study to assess for abdominal aortic aneurysm anyway.  We will couple this with arterial duplex ultrasound of the iliac arteries to make sure he does not have Leriche syndrome or an equivalent abnormality.   Medication Adjustments/Labs and Tests  Ordered: Current medicines are reviewed at length with the patient today.  Concerns regarding medicines are outlined above.  Orders Placed This Encounter  Procedures  . VAS US AORTA/IVC/ILIACS   No orders of the defined types were placed in this encounter.   Patient Instructions  Medication Instructions:  No Changes In Medications at this time.  *If you need a refill on your cardiac medications before your next appointment, please call your pharmacy*  Testing/Procedures AORTIC ILIAC ULTRASOUND   Follow-Up: At [facility], you and your health needs are  our priority.  As part of our continuing mission to provide you with exceptional heart care, we have created designated Provider Care Teams.  These Care Teams include your primary Cardiologist (physician) and Advanced Practice Providers (APPs -  Physician Assistants and Nurse Practitioners) who all work together to provide you with the care you need, when you need it.  Your next appointment:   1 year(s)  The format for your next appointment:   In Person  Provider:   Thurmon Fair, MD     Signed, Thurmon Fair, MD  12/11/2020 10:57 PM    [facility]

## 2020-12-17 ENCOUNTER — Other Ambulatory Visit (HOSPITAL_COMMUNITY): Payer: Self-pay | Admitting: Nurse Practitioner

## 2020-12-17 NOTE — Telephone Encounter (Signed)
    *  STAT* If patient is at the pharmacy, call can be transferred to refill team.   1. Which medications need to be refilled? (please list name of each medication and dose if known)   metoprolol succinate (TOPROL-XL) 100 MG 24 hr tablet     2. Which pharmacy/location (including street and city if local pharmacy) is medication to be sent to? [id number] - [place], [place] - [address]  3. Do they need a 30 day or 90 day supply? 90 days   Pt called back to f/u refill. He said he is out of medications

## 2020-12-20 ENCOUNTER — Other Ambulatory Visit: Payer: Self-pay

## 2020-12-20 ENCOUNTER — Ambulatory Visit (HOSPITAL_COMMUNITY)
Admission: RE | Admit: 2020-12-20 | Discharge: 2020-12-20 | Disposition: A | Discharge status: Home / Self Care | Payer: Medicare Other | Source: Ambulatory Visit | Attending: Cardiology | Admitting: Cardiology

## 2020-12-20 DIAGNOSIS — I739 Peripheral vascular disease, unspecified: Secondary | ICD-10-CM | POA: Diagnosis present

## 2021-05-04 ENCOUNTER — Other Ambulatory Visit: Payer: Self-pay | Admitting: Cardiology

## 2021-05-04 DIAGNOSIS — I4819 Other persistent atrial fibrillation: Secondary | ICD-10-CM

## 2021-05-05 ENCOUNTER — Other Ambulatory Visit: Payer: Self-pay | Admitting: Pharmacist

## 2021-05-05 DIAGNOSIS — I4819 Other persistent atrial fibrillation: Secondary | ICD-10-CM

## 2021-05-05 NOTE — Telephone Encounter (Signed)
Prescription refill request for Eliquis received. Indication:  A fib Last office visit:  12/11/20 Scr: overdue. Last Scr 03/06/20 Age: 65 Weight: 276  Patient is overdue for labs.  Called and spoke with patient, will have drawn at [facility] [place]  Patient reports he is out of medication.  Will send him 1 month supply until he has labs drawn.

## 2021-06-10 MED ORDER — AMLODIPINE BESYLATE 5 MG PO TABS
10.0000 mg | ORAL_TABLET | Freq: Every day | ORAL | 1 refills | Status: DC
Start: 2021-06-10 — End: 2021-12-09

## 2021-07-10 ENCOUNTER — Telehealth: Payer: Self-pay | Admitting: *Deleted

## 2021-07-10 DIAGNOSIS — I4819 Other persistent atrial fibrillation: Secondary | ICD-10-CM

## 2021-07-10 NOTE — Telephone Encounter (Signed)
Prescription refill request for Eliquis received. Indication: aflutter Last office visit: [place], 12/11/2020 Scr: 1.21, 03/06/2020 Age: 65 yo  Weight: 125.4 kg

## 2021-07-10 NOTE — Telephone Encounter (Signed)
Called pt's PCP and they do not have any recent labs on the pt. Will need to call pt and schedule lab appointment.

## 2021-07-10 NOTE — Telephone Encounter (Signed)
Spoke with Pt's wife (On DPR), since pt was "busy grooming the dog". She is aware that an eliquis refill was requested but he has not gotten his labs draw from the order on 05/05/21. Instructed for pt to have labs draw ASAP to avoid missed doses and refill can then be completed. Pt's wife verbalized understanding. Will follow-up to see when labs are resulted.

## 2021-07-11 MED ORDER — APIXABAN 5 MG PO TABS
5.0000 mg | ORAL_TABLET | Freq: Two times a day (BID) | ORAL | 0 refills | Status: DC
Start: 2021-07-11 — End: 2021-07-14

## 2021-07-11 NOTE — Telephone Encounter (Signed)
Called and spoke to [facility] who confirmed that pt came in today had blood work done. However, results will not be in until tonight or tomorrow. Will send in refill so that pt does not run out of medication.

## 2021-07-11 NOTE — Addendum Note (Signed)
Addended by: Memory Dance on: 07/11/2021 04:10 PM   Modules accepted: Orders

## 2021-07-14 MED ORDER — APIXABAN 5 MG PO TABS: 5.0000 mg | ORAL_TABLET | Freq: Two times a day (BID) | ORAL | 5 refills | Status: DC

## 2021-07-14 NOTE — Addendum Note (Signed)
Addended by: Mellody Dance B on: 07/14/2021 01:33 PM   Modules accepted: Orders

## 2021-07-14 NOTE — Telephone Encounter (Signed)
Called and spoke to lab work who stated that they have the lab work for [employer] from 7/29 results were faxed over to [place] office. Scr: 1.02 ( 07/11/2021). Refill sent.

## 2021-09-19 MED ORDER — LOSARTAN POTASSIUM 100 MG PO TABS: ORAL_TABLET | ORAL | 0 refills | Status: DC

## 2021-09-19 MED ORDER — METOPROLOL SUCCINATE ER 100 MG PO TB24: ORAL_TABLET | ORAL | 0 refills | Status: DC

## 2021-12-03 ENCOUNTER — Other Ambulatory Visit: Payer: Self-pay

## 2021-12-03 ENCOUNTER — Encounter: Payer: Self-pay | Admitting: Cardiovascular Disease

## 2021-12-03 ENCOUNTER — Ambulatory Visit (INDEPENDENT_AMBULATORY_CARE_PROVIDER_SITE_OTHER): Payer: Medicare Other | Admitting: Cardiovascular Disease

## 2021-12-03 VITALS — BP 162/88 | HR 73 | Ht 67.0 in | Wt 292.8 lb

## 2021-12-03 DIAGNOSIS — E7800 Pure hypercholesterolemia, unspecified: Secondary | ICD-10-CM

## 2021-12-03 DIAGNOSIS — I43 Cardiomyopathy in diseases classified elsewhere: Secondary | ICD-10-CM

## 2021-12-03 DIAGNOSIS — I2510 Atherosclerotic heart disease of native coronary artery without angina pectoris: Secondary | ICD-10-CM | POA: Diagnosis not present

## 2021-12-03 DIAGNOSIS — R000 Tachycardia, unspecified: Secondary | ICD-10-CM

## 2021-12-03 DIAGNOSIS — I10 Essential (primary) hypertension: Secondary | ICD-10-CM | POA: Diagnosis not present

## 2021-12-03 DIAGNOSIS — Z7901 Long term (current) use of anticoagulants: Secondary | ICD-10-CM

## 2021-12-03 DIAGNOSIS — I4819 Other persistent atrial fibrillation: Secondary | ICD-10-CM

## 2021-12-03 NOTE — Patient Instructions (Signed)

## 2021-12-03 NOTE — Progress Notes (Signed)
Cardiology Office Note:    Date:  12/03/2021   ID:  Danny Mccormick, DOB May 08, 1956, MRN [id number]  PCP:  Carylon Perches, MD  Cardiologist:  Thurmon Fair, MD  Electrophysiologist:  Regan Lemming, MD   Referring MD: Carylon Perches, MD   Chief Complaint  Patient presents with   Atrial Fibrillation    History of Present Illness:    Danny Mccormick is a 65 y.o. male with atrial flutter and atrial fibrillation, presenting with severe cardiomyopathy (EF 30-35%) in January 2021, now maintaining sinus rhythm following ablation by Dr. Elberta Fortis in March 2021.    Follow-up echocardiography 04/17/2020 has shown complete normalization of left ventricular systolic function.  During preablation work-up his CT showed a severely elevated calcium score (95th percentile), but he has never had symptoms of CAD.  He has hypertension and hypercholesterolemia but does not have diabetes mellitus.  He is not a smoker.  He is now off antiarrhythmics other than metoprolol.  He's done very well since his last appointment.  He has not had any clinical recurrence of atrial fibrillation.  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  He does not have orthopnea, PND or palpitations.  He occasionally has mild swelling of the ankles towards the end of the day.  He has not had any falls, injuries or serious bleeding problems, but often has blood from his mouth when he wakes up in the morning (he has a lot of issues with his teeth and gums).  He has not had any focal neurological complaints.  His blood pressure is a little high today, but he was quite upset [place] [address] today and did not take his medicines yet.  Repeat lipid profile performed in July 2022 shows all lipid parameters in target range and a hemoglobin A1c of 6.2% (not on medications for diabetes).  He remains morbidly obese with a BMI of 46.  His coronary CT was performed without  the use of nitroglycerin and was deemed "insufficient for plaque evaluation".  The calcium score was 1430, 95th percentile for age and gender.  Past Medical History:  Diagnosis Date   Arthritis    Atrial fibrillation with RVR (HCC) 01/01/2020   Eczema    Hypertension     Past Surgical History:  Procedure Laterality Date   ATRIAL FIBRILLATION ABLATION N/A 03/13/2020   Procedure: ATRIAL FIBRILLATION ABLATION;  Mccormick: Regan Lemming, MD;  Location: [facility] INVASIVE [facility];  Service: Cardiovascular;  Laterality: N/A;   BUBBLE STUDY  01/01/2020   Procedure: BUBBLE STUDY;  Mccormick: Parke Poisson, MD;  Location: [facility] ENDOSCOPY;  Service: Cardiovascular;;   CARDIOVERSION N/A 01/01/2020   Procedure: CARDIOVERSION;  Mccormick: Parke Poisson, MD;  Location: [facility] ENDOSCOPY;  Service: Cardiovascular;  Laterality: N/A;   HAND SURGERY     KNEE SURGERY     TEE WITHOUT CARDIOVERSION N/A 01/01/2020   Procedure: TRANSESOPHAGEAL ECHOCARDIOGRAM (TEE);  Mccormick: Parke Poisson, MD;  Location: [facility] ENDOSCOPY;  Service: Cardiovascular;  Laterality: N/A;    Current Medications: Current Meds  Medication Sig   acetaminophen (TYLENOL) 500 MG tablet Take 500-1,000 mg by mouth every 6 (six) hours as needed for moderate pain.   amLODipine (NORVASC) 5 MG tablet Take 2 tablets (10 mg total) by mouth daily.   apixaban (ELIQUIS) 5 MG TABS tablet Take 1 tablet (5 mg total) by mouth 2 (two) times daily.   Apremilast (OTEZLA) 30  MG TABS Take 30 mg by mouth 2 (two) times daily.   losartan (COZAAR) 100 MG tablet TAKE 1 TABLET BY MOUTH ONCE DAILY   metoprolol succinate (TOPROL-XL) 100 MG 24 hr tablet TAKE 1/2 (ONE-HALF) TABLET BY MOUTH ONCE DAILY WITH  OR  IMMEDIATELY  FOLLOWING  A  MEAL     Allergies:   Patient has no known allergies.   Social History   Socioeconomic History   Marital status: Married    Spouse name: Not on file   Number of children: Not on file   Years of education: Not on file   Highest  education level: Not on file  Occupational History   Not on file  Tobacco Use   Smoking status: Former   Smokeless tobacco: Never  Vaping Use   Vaping Use: Never used  Substance and Sexual Activity   Alcohol use: Yes    Comment: occ   Drug use: Never   Sexual activity: Not on file  Other Topics Concern   Not on file  Social History Narrative   Not on file   Social Determinants of Health   Financial Resource Strain: Not on file  Food Insecurity: Not on file  Transportation Needs: Not on file  Physical Activity: Not on file  Stress: Not on file  Social Connections: Not on file     Family History: The patient's family history includes Atrial fibrillation in his brother.  ROS:   Please see the history of present illness.    All other systems are reviewed and are negative.   EKGs/Labs/Other Studies Reviewed:    The following studies were reviewed today: Cardiac CT 03/08/2020 Ablation procedure notes 03/13/2020 Echo 04/17/2020  1. Left ventricular ejection fraction, by estimation, is 55 to 60%. The  left ventricle has normal function. The left ventricle has no regional  wall motion abnormalities. There is mild concentric left ventricular  hypertrophy. Left ventricular diastolic  parameters are indeterminate.   2. The mitral valve is normal in structure. Trivial mitral valve  regurgitation. No evidence of mitral stenosis.   3. The aortic valve is grossly normal. Aortic valve regurgitation is not  visualized. No aortic stenosis is present.   Comparison(s): EF improved from prior study. Aorta-Iliac [place] 12/20/2020 Technically challenging examination.  Abdominal Aorta: No evidence of an abdominal aortic aneurysm was  visualized. The largest aortic measurement is 2.5 cm.  Stenosis:  Aorta-iliac atherosclerosis without evidence of focal stenosis.   EKG:  EKG is ordered today. It shows NSR, normal tracing.  Recent Labs: No results found for requested labs within last  8760 hours.  Recent Lipid Panel    Component Value Date/Time   CHOL 152 09/12/2020 1141   TRIG 94 09/12/2020 1141   HDL 57 09/12/2020 1141   CHOLHDL 2.7 09/12/2020 1141   LDLCALC 78 09/12/2020 1141    Physical Exam:    VS:  BP (!) 162/88    Pulse 73    Ht 5\' 7"  (1.702 m)    Wt 292 lb 12.8 oz (132.8 kg)    SpO2 96%    BMI 45.86 kg/m     Wt Readings from Last 3 Encounters:  12/03/21 292 lb 12.8 oz (132.8 kg)  12/11/20 276 lb 6.4 oz (125.4 kg)  10/01/20 284 lb (128.8 kg)     General: Alert, oriented x3, no distress, he is morbidly obese, but also appears strong and fit Head: no evidence of trauma, PERRL, EOMI, no exophtalmos or lid lag, no myxedema,  no xanthelasma; normal ears, nose and oropharynx Neck: normal jugular venous pulsations and no hepatojugular reflux; brisk carotid pulses without delay and no carotid bruits Chest: clear to auscultation, no signs of consolidation by percussion or palpation, normal fremitus, symmetrical and full respiratory excursions Cardiovascular: normal position and quality of the apical impulse, regular rhythm, normal first and second heart sounds, no murmurs, rubs or gallops Abdomen: no tenderness or distention, no masses by palpation, no abnormal pulsatility or arterial bruits, normal bowel sounds, no hepatosplenomegaly Extremities: no clubbing, cyanosis or edema; 2+ radial, ulnar and brachial pulses bilaterally; 2+ right femoral, posterior tibial and dorsalis pedis pulses; 2+ left femoral, posterior tibial and dorsalis pedis pulses; no subclavian or femoral bruits Neurological: grossly nonfocal Psych: Normal mood and affect    ASSESSMENT:    1. Tachycardia induced cardiomyopathy (HCC)   2. Persistent atrial fibrillation (HCC)   3. Long term current use of anticoagulant   4. Essential hypertension   5. Morbid obesity (HCC)   6. Atherosclerosis of native coronary artery of native heart without angina pectoris   7. Hypercholesterolemia      PLAN:    In order of problems listed above:  CHF: Resolved tachycardia cardiomyopathy. AFib/AFlutter: Excellent long-term response to ablation, now off all antiarrhythmics.  Continues to take anticoagulation.  CHA2DS2-VASc 3-5 (age, hypertension, extensive coronary atherosclerosis on CT, recovered cardiomyopathy, borderline diabetes). Anticoagulation: His mouth bleeding seems to be related to gum disease.  This is not a reason to stop his Eliquis.  Recommended follow-up with his dentist. HTN: Elevated today, but usually well controlled.  Asked him to check his blood pressure at home and send me some MyChart readings. Morbid obesity: He does not have symptoms of sleep apnea.  We have discussed the fact that his weight would predispose him to recurrence of atrial fibrillation.  Strongly encourage weight loss. Elevated calcium score: I think this should be considered a coronary artery disease equivalent, but he is completely asymptomatic.  He is capable of performing very intense physical activity without angina or dyspnea.  There is no clinical evidence of significant coronary stenoses, although he has a very high calcium score.  Although he has not had a formal assessment for CAD (coronary CT angiogram was suboptima for coronary purposes), I think the focus needs to be on secondary prevention with risk factor management, especially lipid-lowering therapy.  He is not on aspirin due to full anticoagulation.  He is on a high dose of beta-blockers. Hypercholesterolemia: All lipid parameters in target range on the current dose of statin which he should continue. Buttock claudication: No evidence of obstruction at the level of the aorta or iliac arteries.  No evidence of aortic aneurysm.  He probably has neurogenic  Medication Adjustments/Labs and Tests Ordered: Current medicines are reviewed at length with the patient today.  Concerns regarding medicines are outlined above.  Orders Placed This  Encounter  Procedures   EKG 12-Lead   No orders of the defined types were placed in this encounter.   Patient Instructions  Medication Instructions:  No changes *If you need a refill on your cardiac medications before your next appointment, please call your pharmacy*   Lab Work: None ordered If you have labs (blood work) drawn today and your tests are completely normal, you will receive your results only by: MyChart Message (if you have MyChart) OR A paper copy in the mail If you have any lab test that is abnormal or we need to change your treatment, we will  call you to review the results.   Testing/Procedures: None ordered   Follow-Up: At [facility], you and your health needs are our priority.  As part of our continuing mission to provide you with exceptional heart care, we have created designated Provider Care Teams.  These Care Teams include your primary Cardiologist (physician) and Advanced Practice Providers (APPs -  Physician Assistants and Nurse Practitioners) who all work together to provide you with the care you need, when you need it.  We recommend signing up for the patient portal called "MyChart".  Sign up information is provided on this After Visit Summary.  MyChart is used to connect with patients for Virtual Visits (Telemedicine).  Patients are able to view lab/test results, encounter notes, upcoming appointments, etc.  Non-urgent messages can be sent to your provider as well.   To learn more about what you can do with MyChart, go to [email or web address].    Your next appointment:   12 month(s)  The format for your next appointment:   In Person  Provider:   Thurmon Fair, MD        Signed, Thurmon Fair, MD  12/03/2021 11:01 AM    [facility]

## 2021-12-08 ENCOUNTER — Encounter: Payer: Self-pay | Admitting: Cardiology

## 2021-12-08 ENCOUNTER — Other Ambulatory Visit: Payer: Self-pay | Admitting: Cardiovascular Disease

## 2021-12-08 ENCOUNTER — Encounter: Payer: Self-pay | Admitting: Cardiovascular Disease

## 2021-12-08 DIAGNOSIS — I10 Essential (primary) hypertension: Secondary | ICD-10-CM

## 2021-12-09 ENCOUNTER — Other Ambulatory Visit: Payer: Self-pay

## 2021-12-09 MED ORDER — LOSARTAN POTASSIUM 100 MG PO TABS: 100.0000 mg | ORAL_TABLET | Freq: Every day | ORAL | 11 refills | Status: DC

## 2021-12-09 MED ORDER — AMLODIPINE BESYLATE 5 MG PO TABS: 10.0000 mg | ORAL_TABLET | Freq: Every day | ORAL | 3 refills | Status: DC

## 2021-12-09 MED ORDER — METOPROLOL SUCCINATE ER 100 MG PO TB24: ORAL_TABLET | ORAL | 3 refills | Status: DC

## 2021-12-09 NOTE — Telephone Encounter (Signed)
Sent refill to pharmacy. 

## 2021-12-11 ENCOUNTER — Encounter: Payer: Self-pay | Admitting: Cardiovascular Disease

## 2022-03-09 ENCOUNTER — Other Ambulatory Visit: Payer: Self-pay | Admitting: Cardiovascular Disease

## 2022-03-09 MED ORDER — ATORVASTATIN CALCIUM 40 MG PO TABS: 40.0000 mg | ORAL_TABLET | Freq: Every day | ORAL | 0 refills | Status: DC

## 2022-03-24 ENCOUNTER — Other Ambulatory Visit: Payer: Self-pay | Admitting: Cardiology

## 2022-03-24 DIAGNOSIS — I4819 Other persistent atrial fibrillation: Secondary | ICD-10-CM

## 2022-03-24 NOTE — Telephone Encounter (Signed)
Age 66, weight 292 lbs, SCr 1.02 on 07/11/21, last OV 11/2021, afib indication, refill sent in ?

## 2022-05-28 ENCOUNTER — Other Ambulatory Visit: Payer: Self-pay | Admitting: Cardiovascular Disease

## 2022-09-24 ENCOUNTER — Other Ambulatory Visit: Payer: Self-pay | Admitting: Cardiovascular Disease

## 2022-09-24 DIAGNOSIS — I4819 Other persistent atrial fibrillation: Secondary | ICD-10-CM

## 2022-09-24 NOTE — Telephone Encounter (Signed)
Prescription refill request for Eliquis received. Indication: Afib Last office visit: 12/03/21 (Croitoru) Scr: 1.02 (07/11/21) Age: 66 Weight: 132.8kg  Labs overdue. Called pt to schedule lab appt, no answer. Left message.

## 2022-09-25 ENCOUNTER — Other Ambulatory Visit: Payer: Self-pay

## 2022-09-25 DIAGNOSIS — I4819 Other persistent atrial fibrillation: Secondary | ICD-10-CM

## 2022-09-25 MED ORDER — APIXABAN 5 MG PO TABS: 5.0000 mg | ORAL_TABLET | Freq: Two times a day (BID) | ORAL | 4 refills | Status: DC

## 2022-09-25 NOTE — Telephone Encounter (Addendum)
Eliquis 5mg  refill request received. Patient is 66 years old, weight-132.8kg, Crea-1.09 on 07/11/2021 via scanned labs-needs labs, Diagnosis-Afib, and last seen by Dr. Sallyanne Kuster on 12/03/2021. Dose is appropriate based on dosing criteria.   Pt needs updated labs. Called PCP and they do dot have any updated  labs since 07/11/2021.  Called pt and there was no answer on home line & no voicemail.  Called secondary line and spoke with pt and he is willing to be transferred to main line to make an appt. He is aware I will fill rx after appt is made and add labs to be done when he has an appt.   Pt has an appt pending for 12/03/2022 with Dr. Sallyanne Kuster, refill sent and will order labs-CBC, BMET to be done on that day.

## 2022-09-25 NOTE — Telephone Encounter (Signed)
Please refer to duplicate refill encounter from 09/25/2022

## 2022-12-03 ENCOUNTER — Ambulatory Visit: Payer: Medicare Other | Admitting: Cardiovascular Disease

## 2022-12-08 ENCOUNTER — Encounter: Payer: Self-pay | Admitting: Cardiovascular Disease

## 2022-12-08 MED ORDER — LOSARTAN POTASSIUM 100 MG PO TABS: 100.0000 mg | ORAL_TABLET | Freq: Every day | ORAL | 0 refills | Status: DC

## 2022-12-08 MED ORDER — AMLODIPINE BESYLATE 5 MG PO TABS: 10.0000 mg | ORAL_TABLET | Freq: Every day | ORAL | Status: DC

## 2022-12-24 ENCOUNTER — Encounter: Payer: Self-pay | Admitting: Cardiology

## 2022-12-24 MED ORDER — METOPROLOL SUCCINATE ER 100 MG PO TB24: ORAL_TABLET | ORAL | 0 refills | Status: DC

## 2022-12-25 NOTE — Progress Notes (Signed)
Cardiology Clinic Note   Patient Name: Danny Mccormick Date of Encounter: 12/28/2022  Primary Care Provider:  Carylon Perches, MD Primary Cardiologist:  Thurmon Fair, MD  Patient Profile    Danny Mccormick 67 year old male presents to the clinic today for follow-up evaluation of his atrial flutter/A-fib, combined systolic and diastolic CHF and essential hypertension.  Past Medical History    Past Medical History:  Diagnosis Date   Arthritis    Atrial fibrillation with RVR (HCC) 01/01/2020   Eczema    Hypertension    Past Surgical History:  Procedure Laterality Date   ATRIAL FIBRILLATION ABLATION N/A 03/13/2020   Procedure: ATRIAL FIBRILLATION ABLATION;  Surgeon: Regan Lemming, MD;  Location: [facility] INVASIVE CV LAB;  Service: Cardiovascular;  Laterality: N/A;   BUBBLE STUDY  01/01/2020   Procedure: BUBBLE STUDY;  Surgeon: Parke Poisson, MD;  Location: [facility] ENDOSCOPY;  Service: Cardiovascular;;   CARDIOVERSION N/A 01/01/2020   Procedure: CARDIOVERSION;  Surgeon: Parke Poisson, MD;  Location: [facility] ENDOSCOPY;  Service: Cardiovascular;  Laterality: N/A;   HAND SURGERY     KNEE SURGERY     TEE WITHOUT CARDIOVERSION N/A 01/01/2020   Procedure: TRANSESOPHAGEAL ECHOCARDIOGRAM (TEE);  Surgeon: Parke Poisson, MD;  Location: [facility] ENDOSCOPY;  Service: Cardiovascular;  Laterality: N/A;    Allergies  No Known Allergies  History of Present Illness    Danny Mccormick has a PMH of atrial flutter with RVR, HTN, A-fib, chronic combined systolic and diastolic CHF, coronary atherosclerosis, chronic anticoagulation, and hypokalemia.  EF 30-35% 1/21.  He follows with Dr. Elberta Fortis for atrial fibrillation and is status post successful ablation 3/21.  Echocardiogram 5/21 showed complete normalization of LV function.  During preevaluation for ablation he underwent CT which showed severely elevated calcium score placing him in the 95 percentile.  He denied symptoms of coronary disease.  He  was taken off antiarrhythmic therapy and was continued on metoprolol.  He was seen in follow-up by Dr. Royann Shivers on 12/03/2021.  During that time he continued to be stable.  He denied recurrence of atrial fibrillation.  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  He denied chest pain and shortness of breath, PND and palpitations.  He did occasionally note mild lower extremity swelling around his ankles at the end of the day.  He denied falls, injuries, bleeding issues.  He did note blood from his mouth when he would wake up in the morning (was noted to have several issues with his teeth and gums).  He denied neurological complaints.  He presents to the clinic today for follow-up evaluation and states he feels well today.  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  We reviewed his prior cardiac history and he expressed understanding.  He denies chest pain or shortness of breath.  He reports that he ran out of his amlodipine yesterday.  We reviewed his nitroglycerin and recommended use.  He expressed understanding.  His EKG today shows sinus rhythm with premature atrial complexes.  Initially his blood pressure is 150/84 and on recheck is 128/76.  He reports that he is following a low-sodium diet.  I will order a CBC, BMP, fasting lipids and LFTs today.  We will plan follow-up in 12 months.  Today he denies chest pain, shortness of breath, lower extremity edema, fatigue, palpitations, melena, hematuria, hemoptysis, diaphoresis, weakness, presyncope, syncope, orthopnea, and PND.    Home Medications  Prior to Admission medications   Medication Sig Start Date End Date Taking? Authorizing Provider  acetaminophen (TYLENOL) 500 MG tablet Take 500-1,000 mg by mouth every 6 (six) hours as needed for moderate pain.    [provider]  amLODipine (NORVASC) 5 MG tablet Take 2 tablets (10 mg total) by mouth daily. 12/08/22   Croitoru, Mihai, MD  apixaban  (ELIQUIS) 5 MG TABS tablet Take 1 tablet (5 mg total) by mouth 2 (two) times daily. 09/25/22   Croitoru, Mihai, MD  Apremilast (OTEZLA) 30 MG TABS Take 30 mg by mouth 2 (two) times daily.    [provider]  atorvastatin (LIPITOR) 40 MG tablet Take 1 tablet by mouth once daily 05/29/22   Croitoru, Mihai, MD  losartan (COZAAR) 100 MG tablet Take 1 tablet (100 mg total) by mouth daily. 12/08/22   Croitoru, Mihai, MD  metoprolol succinate (TOPROL-XL) 100 MG 24 hr tablet TAKE 1/2 (ONE-HALF) TABLET BY MOUTH ONCE DAILY WITH  OR  IMMEDIATELY  FOLLOWING  A  MEAL 12/24/22   Croitoru, Mihai, MD  nitroGLYCERIN (NITROSTAT) 0.4 MG SL tablet Place 1 tablet (0.4 mg total) under the tongue every 5 (five) minutes as needed for chest pain. 01/03/20 04/11/20  Kroeger, Ovidio Kin., PA-C    Family History    Family History  Problem Relation Age of Onset   Atrial fibrillation Brother    He indicated that his brother is alive.  Social History    Social History   Socioeconomic History   Marital status: Married    Spouse name: Not on file   Number of children: Not on file   Years of education: Not on file   Highest education level: Not on file  Occupational History   Not on file  Tobacco Use   Smoking status: Former   Smokeless tobacco: Never  Vaping Use   Vaping Use: Never used  Substance and Sexual Activity   Alcohol use: Yes    Comment: occ   Drug use: Never   Sexual activity: Not on file  Other Topics Concern   Not on file  Social History Narrative   Not on file   Social Determinants of Health   Financial Resource Strain: Not on file  Food Insecurity: Not on file  Transportation Needs: Not on file  Physical Activity: Not on file  Stress: Not on file  Social Connections: Not on file  Intimate Partner Violence: Not on file     Review of Systems    General:  No chills, fever, night sweats or weight changes.  Cardiovascular:  No chest pain, dyspnea on exertion, edema, orthopnea,  palpitations, paroxysmal nocturnal dyspnea. Dermatological: No rash, lesions/masses Respiratory: No cough, dyspnea Urologic: No hematuria, dysuria Abdominal:   No nausea, vomiting, diarrhea, bright red blood per rectum, melena, or hematemesis Neurologic:  No visual changes, wkns, changes in mental status. All other systems reviewed and are otherwise negative except as noted above.  Physical Exam    VS:  BP 128/76   Pulse 73   Ht 5\' 6"  (1.676 m)   Wt 284 lb 12.8 oz (129.2 kg)   SpO2 95%   BMI 45.97 kg/m  , BMI Body mass index is 45.97 kg/m. GEN: Well nourished, well developed, in no acute distress. HEENT: normal. Neck: Supple, no JVD, carotid bruits, or masses. Cardiac: RRR, no murmurs, rubs, or gallops. No clubbing, cyanosis, edema.  Radials/DP/PT 2+ and equal bilaterally.  Respiratory:  Respirations regular and unlabored, clear to auscultation bilaterally.  GI: Soft, nontender, nondistended, BS + x 4. MS: no deformity or atrophy. Skin: warm and dry, no rash. Neuro:  Strength and sensation are intact. Psych: Normal affect.  Accessory Clinical Findings    Recent Labs: No results found for requested labs within last 365 days.   Recent Lipid Panel    Component Value Date/Time   CHOL 152 09/12/2020 1141   TRIG 94 09/12/2020 1141   HDL 57 09/12/2020 1141   CHOLHDL 2.7 09/12/2020 1141   LDLCALC 78 09/12/2020 1141         ECG personally reviewed by me today-sinus rhythm with premature atrial complexes 73 bpm- No acute changes  Echocardiogram 04/17/2020  IMPRESSIONS     1. Left ventricular ejection fraction, by estimation, is 55 to 60%. The  left ventricle has normal function. The left ventricle has no regional  wall motion abnormalities. There is mild concentric left ventricular  hypertrophy. Left ventricular diastolic  parameters are indeterminate.   2. The mitral valve is normal in structure. Trivial mitral valve  regurgitation. No evidence of mitral stenosis.    3. The aortic valve is grossly normal. Aortic valve regurgitation is not  visualized. No aortic stenosis is present.   Comparison(s): EF improved from prior study.   FINDINGS   Left Ventricle: Left ventricular ejection fraction, by estimation, is 55  to 60%. The left ventricle has normal function. The left ventricle has no  regional wall motion abnormalities. The left ventricular internal cavity  size was normal in size. There is   mild concentric left ventricular hypertrophy.   Left Atrium: Left atrial size was not assessed.   Right Atrium: Right atrial size was not assessed.   Pericardium: There is no evidence of pericardial effusion.   Mitral Valve: The mitral valve is normal in structure. Trivial mitral  valve regurgitation. No evidence of mitral valve stenosis.   Tricuspid Valve: The tricuspid valve is normal in structure. Tricuspid  valve regurgitation is trivial. No evidence of tricuspid stenosis.   Aortic Valve: The aortic valve is grossly normal. Aortic valve  regurgitation is not visualized. No aortic stenosis is present.   Pulmonic Valve: The pulmonic valve was not well visualized. Pulmonic valve  regurgitation is not visualized. No evidence of pulmonic stenosis.   Aorta: The aortic root and ascending aorta are structurally normal, with  no evidence of dilitation.   Venous: The inferior vena cava was not well visualized.   IAS/Shunts: No atrial level shunt detected by color flow Doppler.    Assessment & Plan   1.  Tachycardia induced cardiomyopathy-no increased DOE or activity intolerance.  Had normalization of his EF status post successful ablation 3/21.  Remains physically active farming.  Denies chest pain. Continue losartan, metoprolol, amlodipine Heart healthy low-sodium diet-salty 6 given  Persistent atrial fibrillation-EKG today shows sinus rhythm with premature atrial complexes 73 bpm..  He is status post ablation 3/21 and followed with Dr.  Elberta Fortis. Continue apixaban Maintain physical activity Avoid triggers caffeine, chocolate, EtOH, dehydration etc.  Essential hypertension-BP today 128/76 Continue losartan, metoprolol, amlodipine Heart healthy low-sodium diet-salty 6 given Refill amlodipine  Coronary atherosclerosis-denies chest pain and shortness of breath.  Noted to have coronary atherosclerosis on CT prior to A-fib ablation. Continue amlodipine, metoprolol, atorvastatin  Hyperlipidemia-LDL 78 on 9/21.  No aspirin due to apixaban Heart healthy low-sodium high-fiber diet Continue atorvastatin Follows with PCP  Disposition: Follow-up with Dr. Royann Shivers or me in 12 months.   Thomasene Ripple. Kimberlynn Lumbra NP-C  12/28/2022, 11:47 AM [facility] [address] Office [phone] Fax [phone]    I spent 14 minutes examining this patient, reviewing medications, and using patient centered shared decision making involving her cardiac care.  Prior to her visit I spent greater than 20 minutes reviewing her past medical history,  medications, and prior cardiac tests.

## 2022-12-28 ENCOUNTER — Ambulatory Visit: Payer: Medicare Other | Attending: Cardiovascular Disease | Admitting: General Practice

## 2022-12-28 ENCOUNTER — Encounter: Payer: Self-pay | Admitting: General Practice

## 2022-12-28 VITALS — BP 128/76 | HR 73 | Ht 66.0 in | Wt 284.8 lb

## 2022-12-28 DIAGNOSIS — I10 Essential (primary) hypertension: Secondary | ICD-10-CM | POA: Insufficient documentation

## 2022-12-28 DIAGNOSIS — I43 Cardiomyopathy in diseases classified elsewhere: Secondary | ICD-10-CM | POA: Insufficient documentation

## 2022-12-28 DIAGNOSIS — R000 Tachycardia, unspecified: Secondary | ICD-10-CM | POA: Diagnosis not present

## 2022-12-28 DIAGNOSIS — I4819 Other persistent atrial fibrillation: Secondary | ICD-10-CM

## 2022-12-28 DIAGNOSIS — E7800 Pure hypercholesterolemia, unspecified: Secondary | ICD-10-CM | POA: Diagnosis present

## 2022-12-28 DIAGNOSIS — I2510 Atherosclerotic heart disease of native coronary artery without angina pectoris: Secondary | ICD-10-CM | POA: Diagnosis not present

## 2022-12-28 MED ORDER — AMLODIPINE BESYLATE 5 MG PO TABS: 10.0000 mg | ORAL_TABLET | Freq: Every day | ORAL | 12 refills | Status: AC

## 2022-12-28 NOTE — Patient Instructions (Signed)
Medication Instructions:  The current medical regimen is effective;  continue present plan and medications as directed. Please refer to the Current Medication list given to you today.  *If you need a refill on your cardiac medications before your next appointment, please call your pharmacy*  Lab Work: FASTING LIPID, LFT, CBC AND BMET If you have labs (blood work) drawn today and your tests are completely normal, you will receive your results only by:   Danny Mccormick (if you have MyChart) OR A paper copy in the mail If you have any lab test that is abnormal or we need to change your treatment, we will call you to review the results.  Other Instructions PLEASE READ AND FOLLOW ATTACHED  SALTY 6    Follow-Up: At [facility], you and your health needs are our priority.  As part of our continuing mission to provide you with exceptional heart care, we have created designated Provider Care Teams.  These Care Teams include your primary Cardiologist (physician) and Advanced Practice Providers (APPs -  Physician Assistants and Nurse Practitioners) who all work together to provide you with the care you need, when you need it.  Your next appointment:   12 month(s)  Provider:   Sanda Klein, MD  or Coletta Memos, FNP

## 2023-01-03 ENCOUNTER — Other Ambulatory Visit: Payer: Self-pay | Admitting: Cardiovascular Disease

## 2023-01-21 ENCOUNTER — Encounter (HOSPITAL_COMMUNITY): Payer: Self-pay | Admitting: *Deleted

## 2023-02-16 ENCOUNTER — Other Ambulatory Visit: Payer: Self-pay | Admitting: Cardiovascular Disease

## 2023-02-16 DIAGNOSIS — I4819 Other persistent atrial fibrillation: Secondary | ICD-10-CM

## 2023-02-16 NOTE — Telephone Encounter (Addendum)
Prescription refill request for Eliquis received. Indication: afib  Last office visit: [place], 12/28/2022 Scr: 1.02, 07/11/2021 Age: 67 yo  Weight: 129.1 kg    Pt is overdue for blood work. Called pt no answer.

## 2023-02-17 NOTE — Telephone Encounter (Signed)
Called PCP to see if any labs have been in the last year (2023) and Kathlee Nations states they do not have any. Therefore, will call the pt to advise need updated labs.   Called pt and there was no answer and the phone continued to ring without voicemail & received response that call can not be completed at this time. Will try back at a later time.

## 2023-02-18 NOTE — Telephone Encounter (Signed)
Attempted to call pt no answer  

## 2023-02-22 LAB — LIPID PANEL

## 2023-02-23 LAB — BASIC METABOLIC PANEL
BUN/Creatinine Ratio: 23 (ref 10–24)
BUN: 22 mg/dL (ref 8–27)
CO2: 21 mmol/L (ref 20–29)
Calcium: 9.1 mg/dL (ref 8.6–10.2)
Chloride: 110 mmol/L — ABNORMAL HIGH (ref 96–106)
Creatinine, Ser: 0.97 mg/dL (ref 0.76–1.27)
Glucose: 123 mg/dL — ABNORMAL HIGH (ref 70–99)
Potassium: 5.7 mmol/L — ABNORMAL HIGH (ref 3.5–5.2)
Sodium: 147 mmol/L — ABNORMAL HIGH (ref 134–144)
eGFR: 85 mL/min/{1.73_m2} (ref >59)

## 2023-02-23 LAB — CBC
Hematocrit: 40.5 % (ref 37.5–51.0)
Hemoglobin: 13.5 g/dL (ref 13.0–17.7)
MCH: 30.3 pg (ref 26.6–33.0)
MCHC: 33.3 g/dL (ref 31.5–35.7)
MCV: 91 fL (ref 79–97)
Platelets: 246 10*3/uL (ref 150–450)
RBC: 4.46 x10E6/uL (ref 4.14–5.80)
RDW: 12.3 % (ref 11.6–15.4)
WBC: 6.2 10*3/uL (ref 3.4–10.8)

## 2023-02-23 LAB — HEPATIC FUNCTION PANEL
ALT: 17 IU/L (ref 0–44)
AST: 20 IU/L (ref 0–40)
Albumin: 4.4 g/dL (ref 3.9–4.9)
Alkaline Phosphatase: 72 IU/L (ref 44–121)
Bilirubin Total: 0.4 mg/dL (ref 0.0–1.2)
Bilirubin, Direct: 0.14 mg/dL (ref 0.00–0.40)
Total Protein: 6.6 g/dL (ref 6.0–8.5)

## 2023-02-23 LAB — LIPID PANEL
Chol/HDL Ratio: 2.8 ratio (ref 0.0–5.0)
Cholesterol, Total: 133 mg/dL (ref 100–199)
HDL: 48 mg/dL (ref >39)
LDL Chol Calc (NIH): 71 mg/dL (ref 0–99)
Triglycerides: 67 mg/dL (ref 0–149)
VLDL Cholesterol Cal: 14 mg/dL (ref 5–40)

## 2023-03-02 ENCOUNTER — Other Ambulatory Visit: Payer: Self-pay

## 2023-03-02 DIAGNOSIS — I2510 Atherosclerotic heart disease of native coronary artery without angina pectoris: Secondary | ICD-10-CM

## 2023-03-02 DIAGNOSIS — I10 Essential (primary) hypertension: Secondary | ICD-10-CM

## 2023-03-02 DIAGNOSIS — I4819 Other persistent atrial fibrillation: Secondary | ICD-10-CM

## 2023-03-02 DIAGNOSIS — Z7901 Long term (current) use of anticoagulants: Secondary | ICD-10-CM

## 2023-03-02 DIAGNOSIS — I43 Cardiomyopathy in diseases classified elsewhere: Secondary | ICD-10-CM

## 2023-03-02 DIAGNOSIS — E7800 Pure hypercholesterolemia, unspecified: Secondary | ICD-10-CM

## 2023-03-10 LAB — CBC
Hematocrit: 42.4 % (ref 37.5–51.0)
Hemoglobin: 14.2 g/dL (ref 13.0–17.7)
MCH: 30.3 pg (ref 26.6–33.0)
MCHC: 33.5 g/dL (ref 31.5–35.7)
MCV: 91 fL (ref 79–97)
Platelets: 245 10*3/uL (ref 150–450)
RBC: 4.68 x10E6/uL (ref 4.14–5.80)
RDW: 13 % (ref 11.6–15.4)
WBC: 6.5 10*3/uL (ref 3.4–10.8)

## 2023-03-11 LAB — BASIC METABOLIC PANEL
BUN/Creatinine Ratio: 20 (ref 10–24)
BUN: 20 mg/dL (ref 8–27)
CO2: 23 mmol/L (ref 20–29)
Calcium: 10.1 mg/dL (ref 8.6–10.2)
Chloride: 100 mmol/L (ref 96–106)
Creatinine, Ser: 0.99 mg/dL (ref 0.76–1.27)
Glucose: 115 mg/dL — ABNORMAL HIGH (ref 70–99)
Potassium: 5.1 mmol/L (ref 3.5–5.2)
Sodium: 139 mmol/L (ref 134–144)
eGFR: 83 mL/min/{1.73_m2} (ref >59)

## 2023-03-16 ENCOUNTER — Other Ambulatory Visit: Payer: Self-pay | Admitting: Cardiovascular Disease

## 2023-03-16 DIAGNOSIS — I4819 Other persistent atrial fibrillation: Secondary | ICD-10-CM

## 2023-03-16 MED ORDER — NITROGLYCERIN 0.4 MG SL SUBL: 0.4000 mg | SUBLINGUAL_TABLET | SUBLINGUAL | 1 refills | Status: AC | PRN

## 2023-03-16 MED ORDER — METOPROLOL SUCCINATE ER 100 MG PO TB24: ORAL_TABLET | ORAL | 2 refills | Status: DC

## 2023-03-16 MED ORDER — APIXABAN 5 MG PO TABS: 5.0000 mg | ORAL_TABLET | Freq: Two times a day (BID) | ORAL | 5 refills | Status: DC

## 2023-03-16 NOTE — Telephone Encounter (Signed)
Pt last saw Coletta Memos, NP on 12/28/22, last labs 03/10/23 Creat 0.99, age 67, weight 129.2kg, based on specified criteria pt is on appropriate dosage of Eliquis 5mg  BID for afib.  Will refill rx.

## 2023-07-14 ENCOUNTER — Encounter: Payer: Self-pay | Admitting: Cardiovascular Disease

## 2023-07-14 ENCOUNTER — Other Ambulatory Visit: Payer: Self-pay

## 2023-07-14 MED ORDER — LOSARTAN POTASSIUM 100 MG PO TABS: 100.0000 mg | ORAL_TABLET | Freq: Every day | ORAL | 6 refills | Status: DC

## 2023-09-05 ENCOUNTER — Other Ambulatory Visit: Payer: Self-pay | Admitting: Cardiovascular Disease

## 2023-09-05 DIAGNOSIS — I4819 Other persistent atrial fibrillation: Secondary | ICD-10-CM

## 2023-09-06 NOTE — Telephone Encounter (Signed)
Prescription refill request for Eliquis received. Indication:afib Last office visit:1/24 Scr:0.99  3/24 Age: 67 Weight:129.2  kg  Prescription refilled

## 2023-12-09 ENCOUNTER — Other Ambulatory Visit: Payer: Self-pay | Admitting: Cardiovascular Disease

## 2023-12-20 NOTE — Progress Notes (Signed)
 Cardiology Clinic Note   Patient Name: Danny Mccormick Date of Encounter: 12/23/2023  Primary Care Provider:  Sheryle Carwin, MD Primary Cardiologist:  Jerel Balding, MD  Patient Profile    Danny Mccormick 68 year old male presents to the clinic today for follow-up evaluation of his atrial flutter/A-fib, combined systolic and diastolic CHF and essential hypertension.  Past Medical History    Past Medical History:  Diagnosis Date   Arthritis    Atrial fibrillation with RVR (HCC) 01/01/2020   Eczema    Hypertension    Past Surgical History:  Procedure Laterality Date   ATRIAL FIBRILLATION ABLATION N/A 03/13/2020   Procedure: ATRIAL FIBRILLATION ABLATION;  Surgeon: Inocencio Soyla Lunger, MD;  Location: [facility] INVASIVE CV LAB;  Service: Cardiovascular;  Laterality: N/A;   BUBBLE STUDY  01/01/2020   Procedure: BUBBLE STUDY;  Surgeon: Loni Soyla LABOR, MD;  Location: [facility] ENDOSCOPY;  Service: Cardiovascular;;   CARDIOVERSION N/A 01/01/2020   Procedure: CARDIOVERSION;  Surgeon: Loni Soyla LABOR, MD;  Location: [facility] ENDOSCOPY;  Service: Cardiovascular;  Laterality: N/A;   HAND SURGERY     KNEE SURGERY     TEE WITHOUT CARDIOVERSION N/A 01/01/2020   Procedure: TRANSESOPHAGEAL ECHOCARDIOGRAM (TEE);  Surgeon: Loni Soyla LABOR, MD;  Location: [facility] ENDOSCOPY;  Service: Cardiovascular;  Laterality: N/A;    Allergies  No Known Allergies  History of Present Illness    Danny Mccormick has a PMH of atrial flutter with RVR, HTN, A-fib, chronic combined systolic and diastolic CHF, coronary atherosclerosis, chronic anticoagulation, and hypokalemia.  EF 30-35% 1/21.  He follows with Dr. Inocencio for atrial fibrillation and is status post successful ablation 3/21.  Echocardiogram 5/21 showed complete normalization of LV function.  During preevaluation for ablation he underwent CT which showed severely elevated calcium  score placing him in the 95 percentile.  He denied symptoms of coronary disease.  He was  taken off antiarrhythmic therapy and was continued on metoprolol .  He was seen in follow-up by Dr. Balding on 12/03/2021.  During that time he continued to be stable.  He denied recurrence of atrial fibrillation.  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  He denied chest pain and shortness of breath, PND and palpitations.  He did occasionally note mild lower extremity swelling around his ankles at the end of the day.  He denied falls, injuries, bleeding issues.  He did note blood from his mouth when he would wake up in the morning (was noted to have several issues with his teeth and gums).  He denied neurological complaints.  He presented to the clinic 12/28/22 for follow-up evaluation and stated he felt well.  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  We reviewed his prior cardiac history and he expressed understanding.  He denied chest pain or shortness of breath.  He reported that he ran out of his amlodipine  the prior.  We reviewed his nitroglycerin  and recommended use.  He expressed understanding.  His EKG showed sinus rhythm with premature atrial complexes.  Initially his blood pressure was 150/84 and on recheck was 128/76.  He reported that he was following a low-sodium diet.  I ordered a CBC, BMP, fasting lipids and LFTs.   Follow-up in 12 months was planned.  He presents to the clinic today for follow-up evaluation and states he continues to do well.  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  His blood pressure initially today is 152/86 and on recheck is 136/78.  We reviewed his lab work from last March.  I will repeat his lipids, CBC and CMP 3/25.  He has reduced the carbohydrates in his diet and has been losing weight.  I will continue his current medication regimen and plan follow-up in 12 months.  Today he denies chest  pain, shortness of breath, lower extremity edema, fatigue, palpitations, melena, hematuria, hemoptysis, diaphoresis, weakness, presyncope, syncope, orthopnea, and PND.    Home Medications    Prior to Admission medications   Medication Sig Start Date End Date Taking? Authorizing Provider  acetaminophen  (TYLENOL ) 500 MG tablet Take 500-1,000 mg by mouth every 6 (six) hours as needed for moderate pain.    [provider]  amLODipine  (NORVASC ) 5 MG tablet Take 2 tablets (10 mg total) by mouth daily. 12/08/22   Croitoru, Mihai, MD  apixaban  (ELIQUIS ) 5 MG TABS tablet Take 1 tablet (5 mg total) by mouth 2 (two) times daily. 09/25/22   Croitoru, Mihai, MD  Apremilast (OTEZLA) 30 MG TABS Take 30 mg by mouth 2 (two) times daily.    [provider]  atorvastatin  (LIPITOR) 40 MG tablet Take 1 tablet by mouth once daily 05/29/22   Croitoru, Mihai, MD  losartan  (COZAAR ) 100 MG tablet Take 1 tablet (100 mg total) by mouth daily. 12/08/22   Croitoru, Mihai, MD  metoprolol  succinate (TOPROL -XL) 100 MG 24 hr tablet TAKE 1/2 (ONE-HALF) TABLET BY MOUTH ONCE DAILY WITH  OR  IMMEDIATELY  FOLLOWING  A  MEAL 12/24/22   Croitoru, Mihai, MD  nitroGLYCERIN  (NITROSTAT ) 0.4 MG SL tablet Place 1 tablet (0.4 mg total) under the tongue every 5 (five) minutes as needed for chest pain. 01/03/20 04/11/20  Kroeger, Bruno HERO., PA-C    Family History    Family History  Problem Relation Age of Onset   Atrial fibrillation Brother    He indicated that his brother is alive.  Social History    Social History   Socioeconomic History   Marital status: Married    Spouse name: Not on file   Number of children: Not on file   Years of education: Not on file   Highest education level: Not on file  Occupational History   Not on file  Tobacco Use   Smoking status: Former   Smokeless tobacco: Never  Vaping Use   Vaping status: Never Used  Substance and Sexual Activity   Alcohol use: Yes    Comment: occ    Drug use: Never   Sexual activity: Not on file  Other Topics Concern   Not on file  Social History Narrative   Not on file   Social Drivers of Health   Financial Resource Strain: Not on file  Food Insecurity: Not on file  Transportation Needs: Not on file  Physical Activity: Not on file  Stress: Not on file  Social Connections: Not on file  Intimate Partner Violence: Not on file     Review of Systems    General:  No chills, fever, night sweats or weight changes.  Cardiovascular:  No chest pain, dyspnea on exertion, edema, orthopnea, palpitations, paroxysmal nocturnal dyspnea. Dermatological: No rash, lesions/masses Respiratory: No cough, dyspnea Urologic: No hematuria, dysuria Abdominal:   No nausea, vomiting, diarrhea, bright red blood per rectum, melena, or hematemesis Neurologic:  No visual changes, wkns, changes in mental status.  All other systems reviewed and are otherwise negative except as noted above.  Physical Exam    VS:  BP 136/78   Pulse 76   Ht 5' 6 (1.676 m)   Wt 280 lb (127 kg)   SpO2 94%   BMI 45.19 kg/m  , BMI Body mass index is 45.19 kg/m. GEN: Well nourished, well developed, in no acute distress. HEENT: normal. Neck: Supple, no JVD, carotid bruits, or masses. Cardiac: RRR, no murmurs, rubs, or gallops. No clubbing, cyanosis, edema.  Radials/DP/PT 2+ and equal bilaterally.  Respiratory:  Respirations regular and unlabored, clear to auscultation bilaterally. GI: Soft, nontender, nondistended, BS + x 4. MS: no deformity or atrophy. Skin: warm and dry, no rash. Neuro:  Strength and sensation are intact. Psych: Normal affect.  Accessory Clinical Findings    Recent Labs: 02/22/2023: ALT 17 03/10/2023: BUN 20; Creatinine, Ser 0.99; Hemoglobin 14.2; Platelets 245; Potassium 5.1; Sodium 139   Recent Lipid Panel    Component Value Date/Time   CHOL 133 02/22/2023 0807   TRIG 67 02/22/2023 0807   HDL 48 02/22/2023 0807   CHOLHDL 2.8 02/22/2023 0807    LDLCALC 71 02/22/2023 0807         ECG personally reviewed by me today-EKG Interpretation Date/Time:  Thursday December 23 2023 11:14:25 EST Ventricular Rate:  76 PR Interval:  186 QRS Duration:  76 QT Interval:  424 QTC Calculation: 477 R Axis:   61  Text Interpretation: Normal sinus rhythm with sinus arrhythmia Normal ECG When compared with ECG of 11-Apr-2020 13:38, No significant change was found Confirmed by Emelia Hazy [phone]) on 12/23/2023 11:24:57 AM   EKG 12/28/22 sinus rhythm with premature atrial complexes 73 bpm- No acute changes  Echocardiogram 04/17/2020  IMPRESSIONS     1. Left ventricular ejection fraction, by estimation, is 55 to 60%. The  left ventricle has normal function. The left ventricle has no regional  wall motion abnormalities. There is mild concentric left ventricular  hypertrophy. Left ventricular diastolic  parameters are indeterminate.   2. The mitral valve is normal in structure. Trivial mitral valve  regurgitation. No evidence of mitral stenosis.   3. The aortic valve is grossly normal. Aortic valve regurgitation is not  visualized. No aortic stenosis is present.   Comparison(s): EF improved from prior study.   FINDINGS   Left Ventricle: Left ventricular ejection fraction, by estimation, is 55  to 60%. The left ventricle has normal function. The left ventricle has no  regional wall motion abnormalities. The left ventricular internal cavity  size was normal in size. There is   mild concentric left ventricular hypertrophy.   Left Atrium: Left atrial size was not assessed.   Right Atrium: Right atrial size was not assessed.   Pericardium: There is no evidence of pericardial effusion.   Mitral Valve: The mitral valve is normal in structure. Trivial mitral  valve regurgitation. No evidence of mitral valve stenosis.   Tricuspid Valve: The tricuspid valve is normal in structure. Tricuspid  valve regurgitation is trivial. No evidence of  tricuspid stenosis.   Aortic Valve: The aortic valve is grossly normal. Aortic valve  regurgitation is not visualized. No aortic stenosis is present.   Pulmonic Valve: The pulmonic valve was not well visualized. Pulmonic valve  regurgitation is not visualized. No evidence of pulmonic stenosis.   Aorta: The aortic root and ascending aorta are structurally normal, with  no evidence of dilitation.   Venous: The inferior vena cava was not well visualized.  IAS/Shunts: No atrial level shunt detected by color flow Doppler.    Assessment & Plan   1. Persistent atrial fibrillation-EKG today shows normal sinus rhythm 76 bpm.  Status post ablation 3/21 and follows with EP Continue apixaban  Maintain physical activity Avoid triggers caffeine, chocolate, EtOH, dehydration etc.-reviewed Repeat labs 3/25 (CBC, CMP, lipids)   Tachycardia induced cardiomyopathy-physical activity stable at baseline.  With normalization of his EF status post successful ablation 3/21.  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  Denies exertional chest discomfort continue losartan , metoprolol , amlodipine  Heart healthy low-sodium diet  Essential hypertension-BP today  136/78.  Does have elevated blood pressure initially which appears to be related to whitecoat syndrome versus anxiousness [place] to [place]. Continue current medical therapy Maintain blood pressure log  Coronary atherosclerosis-denies anginal symptoms.  Noted to have coronary atherosclerosis on CT prior to A-fib ablation. Continue amlodipine , metoprolol , atorvastatin  Heart healthy low-sodium high-fiber diet  Hyperlipidemia- 02/22/2023: Cholesterol, Total 133; HDL 48; LDL Chol Calc (NIH) 71; Triglycerides 67 .  No aspirin due to apixaban  Heart healthy low-sodium high-fiber diet Continue atorvastatin  Follows with PCP  Disposition: Follow-up with Dr. Francyne or me in 12 months.   Josefa HERO. Rowena Moilanen NP-C     12/23/2023, 12:07 PM [facility] [address] Office [phone] Fax [phone]    I spent 14 minutes examining this patient, reviewing medications, and using patient centered shared decision making involving their cardiac care.  Prior to his visit I spent greater than 20 minutes reviewing past medical history,  medications, and prior cardiac tests.

## 2023-12-23 ENCOUNTER — Encounter: Payer: Self-pay | Admitting: General Practice

## 2023-12-23 ENCOUNTER — Ambulatory Visit: Payer: Medicare Other | Attending: General Practice | Admitting: General Practice

## 2023-12-23 VITALS — BP 136/78 | HR 76 | Ht 66.0 in | Wt 280.0 lb

## 2023-12-23 DIAGNOSIS — I43 Cardiomyopathy in diseases classified elsewhere: Secondary | ICD-10-CM | POA: Diagnosis present

## 2023-12-23 DIAGNOSIS — E7800 Pure hypercholesterolemia, unspecified: Secondary | ICD-10-CM | POA: Insufficient documentation

## 2023-12-23 DIAGNOSIS — R000 Tachycardia, unspecified: Secondary | ICD-10-CM | POA: Insufficient documentation

## 2023-12-23 DIAGNOSIS — I4819 Other persistent atrial fibrillation: Secondary | ICD-10-CM | POA: Insufficient documentation

## 2023-12-23 DIAGNOSIS — I10 Essential (primary) hypertension: Secondary | ICD-10-CM | POA: Diagnosis present

## 2023-12-23 DIAGNOSIS — I2510 Atherosclerotic heart disease of native coronary artery without angina pectoris: Secondary | ICD-10-CM | POA: Insufficient documentation

## 2023-12-23 NOTE — Patient Instructions (Signed)
 Medication Instructions:  The current medical regimen is effective;  continue present plan and medications as directed. Please refer to the Current Medication list given to you today.  *If you need a refill on your cardiac medications before your next appointment, please call your pharmacy*  Lab Work: FASTING LIPID, CMP AND CBC IN MARCH 2025 If you have labs (blood work) drawn today and your tests are completely normal, you will receive your results only by:  MyChart Message (if you have MyChart) OR A paper copy in the mail If you have any lab test that is abnormal or we need to change your treatment, we will call you to review the results.  Other Instructions CONTINUE CURRENT DIET CONTINUE PHYSICAL ACTIVITY  Follow-Up: At [facility], you and your health needs are our priority.  As part of our continuing mission to provide you with exceptional heart care, we have created designated Provider Care Teams.  These Care Teams include your primary Cardiologist (physician) and Advanced Practice Providers (APPs -  Physician Assistants and Nurse Practitioners) who all work together to provide you with the care you need, when you need it.  Your next appointment:   12 month(s)  Provider:   Jerel Balding, MD

## 2024-01-17 ENCOUNTER — Other Ambulatory Visit: Payer: Self-pay | Admitting: Cardiovascular Disease

## 2024-02-14 LAB — LIPID PANEL

## 2024-02-15 LAB — LIPID PANEL
Cholesterol, Total: 147 mg/dL (ref 100–199)
HDL: 50 mg/dL (ref >39)
LDL CALC COMMENT:: 2.9 ratio (ref 0.0–5.0)
LDL Chol Calc (NIH): 81 mg/dL (ref 0–99)
Triglycerides: 83 mg/dL (ref 0–149)
VLDL Cholesterol Cal: 16 mg/dL (ref 5–40)

## 2024-02-15 LAB — COMPREHENSIVE METABOLIC PANEL
ALT: 18 IU/L (ref 0–44)
AST: 21 IU/L (ref 0–40)
Albumin: 4.5 g/dL (ref 3.9–4.9)
Alkaline Phosphatase: 65 IU/L (ref 44–121)
BUN/Creatinine Ratio: 21 (ref 10–24)
BUN: 20 mg/dL (ref 8–27)
Bilirubin Total: 0.6 mg/dL (ref 0.0–1.2)
CO2: 23 mmol/L (ref 20–29)
Calcium: 9.7 mg/dL (ref 8.6–10.2)
Chloride: 103 mmol/L (ref 96–106)
Creatinine, Ser: 0.97 mg/dL (ref 0.76–1.27)
Globulin, Total: 2.3 g/dL (ref 1.5–4.5)
Glucose: 101 mg/dL — ABNORMAL HIGH (ref 70–99)
Potassium: 5.6 mmol/L — ABNORMAL HIGH (ref 3.5–5.2)
Sodium: 141 mmol/L (ref 134–144)
Total Protein: 6.8 g/dL (ref 6.0–8.5)
eGFR: 85 mL/min/{1.73_m2} (ref >59)

## 2024-02-15 LAB — CBC
Hematocrit: 42.3 % (ref 37.5–51.0)
Hemoglobin: 13.9 g/dL (ref 13.0–17.7)
MCH: 31 pg (ref 26.6–33.0)
MCHC: 32.9 g/dL (ref 31.5–35.7)
MCV: 94 fL (ref 79–97)
Platelets: 247 10*3/uL (ref 150–450)
RBC: 4.48 x10E6/uL (ref 4.14–5.80)
RDW: 13.4 % (ref 11.6–15.4)
WBC: 6.6 10*3/uL (ref 3.4–10.8)

## 2024-02-16 ENCOUNTER — Other Ambulatory Visit: Payer: Self-pay

## 2024-03-01 ENCOUNTER — Other Ambulatory Visit: Payer: Self-pay | Admitting: Cardiovascular Disease

## 2024-03-01 DIAGNOSIS — I4819 Other persistent atrial fibrillation: Secondary | ICD-10-CM

## 2024-03-01 NOTE — Telephone Encounter (Signed)
 Eliquis 5mg  refill request received. Patient is 68 years old, weight-127kg, Crea-0.97 on 02/14/24, Diagnosis-Afib, and last seen by Edd Fabian on 12/23/23. Dose is appropriate based on dosing criteria. Will send in refill to requested pharmacy.

## 2024-03-09 ENCOUNTER — Other Ambulatory Visit: Payer: Self-pay | Admitting: Cardiovascular Disease

## 2024-08-29 ENCOUNTER — Other Ambulatory Visit: Payer: Self-pay | Admitting: Cardiovascular Disease

## 2024-08-29 DIAGNOSIS — I4819 Other persistent atrial fibrillation: Secondary | ICD-10-CM

## 2024-08-29 NOTE — Telephone Encounter (Signed)
 Prescription refill request for Eliquis  received. Indication:afib Last office visit:1/25 Scr:0.97  3/25 Age: 68 Weight:127  kg  Prescription refilled

## 2024-09-26 ENCOUNTER — Encounter: Payer: Self-pay | Admitting: Cardiovascular Disease

## 2024-11-07 ENCOUNTER — Encounter: Payer: Self-pay | Admitting: Gastroenterology

## 2024-12-26 ENCOUNTER — Encounter: Payer: Self-pay | Admitting: Gastroenterology

## 2024-12-26 ENCOUNTER — Telehealth: Payer: Self-pay | Admitting: *Deleted

## 2024-12-26 ENCOUNTER — Ambulatory Visit: Admitting: Gastroenterology

## 2024-12-26 VITALS — BP 130/76 | HR 74 | Temp 98.7°F | Ht 66.0 in | Wt 284.2 lb

## 2024-12-26 DIAGNOSIS — Z01818 Encounter for other preprocedural examination: Secondary | ICD-10-CM

## 2024-12-26 DIAGNOSIS — Z79899 Other long term (current) drug therapy: Secondary | ICD-10-CM | POA: Insufficient documentation

## 2024-12-26 DIAGNOSIS — Z1211 Encounter for screening for malignant neoplasm of colon: Secondary | ICD-10-CM | POA: Insufficient documentation

## 2024-12-26 NOTE — H&P (View-Only) (Signed)
 "    GI Office Note    Referring Provider: Doroteo Deward BIRCH, FNP Primary Care Physician:  Doroteo Deward BIRCH, FNP  Primary Gastroenterologist: Ozell Hollingshead, MD   Chief Complaint   Chief Complaint  Patient presents with   New Patient (Initial Visit)    Pt here for screening for colon cancer     History of Present Illness   Danny Mccormick is a 69 y.o. male presenting today to schedule first ever colonoscopy. He is on Eliquis  with history of Afib.   Discussed the use of AI scribe software for clinical note transcription with the patient, who gave verbal consent to proceed.  History of Present Illness   He has never had colonoscopy or other colorectal cancer screening. He denies bowel habit changes, rectal bleeding, or anemia. Bowel movements are regular daily, occasionally more than once. He denies chronic heartburn or indigestion. He rarely has mild heartburn with specific trigger foods such as Italian food, green onions, and green peppers.  There is no family history of colon cancer. His parents and brothers have had normal colonoscopies.  He has atrial fibrillation treated with prior cardiac ablation and has been in NSR at follow ups. He takes Eliquis  and metoprolol . He has appointment with cardiology for follow up tomorrow.       Prior Data   Results   02/2024: Hgb 13.9, platelets 247, LFTs normal, creatinine 0.97  Medications   Current Outpatient Medications  Medication Sig Dispense Refill   acetaminophen  (TYLENOL ) 500 MG tablet Take 500-1,000 mg by mouth every 6 (six) hours as needed for moderate pain.     amLODipine  (NORVASC ) 5 MG tablet Take 2 tablets (10 mg total) by mouth daily. 30 tablet 12   apixaban  (ELIQUIS ) 5 MG TABS tablet Take 1 tablet by mouth twice daily 180 tablet 1   atorvastatin  (LIPITOR) 40 MG tablet Take 1 tablet by mouth once daily 90 tablet 3   losartan  (COZAAR ) 100 MG tablet Take 1 tablet by mouth once daily 90 tablet 3   metoprolol  succinate  (TOPROL -XL) 100 MG 24 hr tablet TAKE 1/2 (ONE-HALF) TABLET BY MOUTH ONCE DAILY WITH  OR  IMMEDIATELY  FOLLOWING  A  MEAL. 45 tablet 2   nitroGLYCERIN  (NITROSTAT ) 0.4 MG SL tablet Place 1 tablet (0.4 mg total) under the tongue every 5 (five) minutes as needed for chest pain. 25 tablet 1   No current facility-administered medications for this visit.    Allergies   Allergies as of 12/26/2024   (No Known Allergies)    Past Medical History   Past Medical History:  Diagnosis Date   Arthritis    Atrial fibrillation with RVR (HCC) 01/01/2020   CAD (coronary artery disease)    Cardiomyopathy (HCC)    tachycardia induced, EF normalized after afib ablation in 2021.   Eczema    Hypertension     Past Surgical History   Past Surgical History:  Procedure Laterality Date   ATRIAL FIBRILLATION ABLATION N/A 03/13/2020   Procedure: ATRIAL FIBRILLATION ABLATION;  Surgeon: Inocencio Soyla Lunger, MD;  Location: [facility] INVASIVE CV LAB;  Service: Cardiovascular;  Laterality: N/A;   BUBBLE STUDY  01/01/2020   Procedure: BUBBLE STUDY;  Surgeon: Loni Soyla LABOR, MD;  Location: [facility] ENDOSCOPY;  Service: Cardiovascular;;   CARDIOVERSION N/A 01/01/2020   Procedure: CARDIOVERSION;  Surgeon: Loni Soyla LABOR, MD;  Location: [facility] ENDOSCOPY;  Service: Cardiovascular;  Laterality: N/A;   HAND SURGERY     KNEE SURGERY  TEE WITHOUT CARDIOVERSION N/A 01/01/2020   Procedure: TRANSESOPHAGEAL ECHOCARDIOGRAM (TEE);  Surgeon: Loni Soyla LABOR, MD;  Location: [facility] ENDOSCOPY;  Service: Cardiovascular;  Laterality: N/A;    Past Family History   Family History  Problem Relation Age of Onset   Atrial fibrillation Brother    Colon cancer Neg Hx     Past Social History   Social History   Socioeconomic History   Marital status: Married    Spouse name: Not on file   Number of children: Not on file   Years of education: Not on file   Highest education level: Not on file  Occupational History   Not on file   Tobacco Use   Smoking status: Former   Smokeless tobacco: Never  Vaping Use   Vaping status: Never Used  Substance and Sexual Activity   Alcohol use: Yes    Comment: occ   Drug use: Never   Sexual activity: Not on file  Other Topics Concern   Not on file  Social History Narrative   Not on file   Social Drivers of Health   Tobacco Use: Medium Risk (12/26/2024)   Patient History    Smoking Tobacco Use: Former    Smokeless Tobacco Use: Never    Passive Exposure: Not on Actuary Strain: Not on file  Food Insecurity: Not on file  Transportation Needs: Not on file  Physical Activity: Not on file  Stress: Not on file  Social Connections: Not on file  Intimate Partner Violence: Not on file  Depression (EYV7-0): Not on file  Alcohol Screen: Not on file  Housing: Not on file  Utilities: Not on file  Health Literacy: Not on file    Review of Systems   General: Negative for anorexia, weight loss, fever, chills, fatigue, weakness. Eyes: Negative for vision changes.  ENT: Negative for hoarseness, difficulty swallowing , nasal congestion. CV: Negative for chest pain, angina, palpitations, dyspnea on exertion, peripheral edema.  Respiratory: Negative for dyspnea at rest, dyspnea on exertion, cough, sputum, wheezing.  GI: See history of present illness. GU:  Negative for dysuria, hematuria, urinary incontinence, urinary frequency, nocturnal urination.  MS: Negative for joint pain, low back pain.  Derm: Negative for rash or itching.  Neuro: Negative for weakness, abnormal sensation, seizure, frequent headaches, memory loss,  confusion.  Psych: Negative for anxiety, depression, suicidal ideation, hallucinations.  Endo: Negative for unusual weight change.  Heme: Negative for bruising or bleeding. Allergy: Negative for rash or hives.  Physical Exam   BP 130/76   Pulse 74   Temp 98.7 F (37.1 C)   Ht 5' 6 (1.676 m)   Wt 284 lb 3.2 oz (128.9 kg)   BMI 45.87  kg/m    General: Well-nourished, well-developed in no acute distress.  Head: Normocephalic, atraumatic.   Eyes: Conjunctiva pink, no icterus. Mouth: Oropharyngeal mucosa moist and pink  Neck: Supple without thyromegaly, masses, or lymphadenopathy.  Lungs: Clear to auscultation bilaterally.  Heart: Regular rate and rhythm, no murmurs rubs or gallops.  Abdomen: Bowel sounds are normal, nontender, nondistended, no hepatosplenomegaly or masses,  no abdominal bruits or hernia, no rebound or guarding.   Rectal: not performed Extremities: No lower extremity edema. No clubbing or deformities.  Neuro: Alert and oriented x 4 , grossly normal neurologically.  Skin: Warm and dry, no rash or jaundice.   Psych: Alert and cooperative, normal mood and affect.  Labs   See above  Imaging Studies   No results  found.  Assessment/Plan:    Assessment & Plan Colorectal cancer screening, average risk -No prior colonoscopy. No prior colon cancer screening -Colonoscopy in the near future. ASA 3.  I have discussed the risks, alternatives, benefits with regards to but not limited to the risk of reaction to medication, bleeding, infection, perforation and the patient is agreeable to proceed. Written consent to be obtained. -We will request he hold Eliquis  48 hours before procedure, we will send clearance to hold medication to his cardiologist. He has appt tomorrow with Josefa Beauvais, NP.       Sonny RAMAN. Ezzard, MHS, PA-C [facility]  "

## 2024-12-26 NOTE — Telephone Encounter (Signed)
" °  Request for patient to stop medication prior to procedure or is needing cleareance  12/26/2024  Danny Mccormick 1955/12/21  What type of surgery is being performed? Colonoscopy  When is surgery scheduled? TBD  What type of clearance is required (medical or pharmacy to hold medication or both? medication  Are there any medications that need to be held prior to surgery and how long? Eliquis  x 2 days  Name of physician performing surgery?  Dr.Rourk [facility] at [employer]: [phone], option 5 Fax: [phone]  Anesthesia type (none, local, MAC, general)? MAC   ? Yes ? No Patient can hold medication as requested   Signature: ___________________________   "

## 2024-12-26 NOTE — Telephone Encounter (Signed)
 Primary Cardiologist:Mihai Croitoru, MD  Chart reviewed as part of pre-operative protocol coverage. Because of Danny Mccormick past medical history and time since last visit, he/she will require a follow-up visit in order to better assess preoperative cardiovascular risk.  Pre-op covering staff: - Patient has appointment with Danny Beauvais, NP on 12/27/24. Appointment notes have been updates.  - Please contact requesting surgeon's office via preferred method (i.e, phone, fax) to inform them of need for appointment prior to surgery.  This message will also be routed to pharmacy pool for input on holding anticoagulant agent as requested below so that this information is available at time of patient's appointment.   Rosaline EMERSON Bane, NP-C  12/26/2024, 10:47 AM [address], [address] [place], [place] [address] Office [phone] Fax [phone]

## 2024-12-26 NOTE — Progress Notes (Signed)
 "    GI Office Note    Referring Provider: Doroteo Deward BIRCH, FNP Primary Care Physician:  Doroteo Deward BIRCH, FNP  Primary Gastroenterologist: Ozell Hollingshead, MD   Chief Complaint   Chief Complaint  Patient presents with   New Patient (Initial Visit)    Pt here for screening for colon cancer     History of Present Illness   Danny Mccormick is a 69 y.o. male presenting today to schedule first ever colonoscopy. He is on Eliquis  with history of Afib.   Discussed the use of AI scribe software for clinical note transcription with the patient, who gave verbal consent to proceed.  History of Present Illness   He has never had colonoscopy or other colorectal cancer screening. He denies bowel habit changes, rectal bleeding, or anemia. Bowel movements are regular daily, occasionally more than once. He denies chronic heartburn or indigestion. He rarely has mild heartburn with specific trigger foods such as Italian food, green onions, and green peppers.  There is no family history of colon cancer. His parents and brothers have had normal colonoscopies.  He has atrial fibrillation treated with prior cardiac ablation and has been in NSR at follow ups. He takes Eliquis  and metoprolol . He has appointment with cardiology for follow up tomorrow.       Prior Data   Results   02/2024: Hgb 13.9, platelets 247, LFTs normal, creatinine 0.97  Medications   Current Outpatient Medications  Medication Sig Dispense Refill   acetaminophen  (TYLENOL ) 500 MG tablet Take 500-1,000 mg by mouth every 6 (six) hours as needed for moderate pain.     amLODipine  (NORVASC ) 5 MG tablet Take 2 tablets (10 mg total) by mouth daily. 30 tablet 12   apixaban  (ELIQUIS ) 5 MG TABS tablet Take 1 tablet by mouth twice daily 180 tablet 1   atorvastatin  (LIPITOR) 40 MG tablet Take 1 tablet by mouth once daily 90 tablet 3   losartan  (COZAAR ) 100 MG tablet Take 1 tablet by mouth once daily 90 tablet 3   metoprolol  succinate  (TOPROL -XL) 100 MG 24 hr tablet TAKE 1/2 (ONE-HALF) TABLET BY MOUTH ONCE DAILY WITH  OR  IMMEDIATELY  FOLLOWING  A  MEAL. 45 tablet 2   nitroGLYCERIN  (NITROSTAT ) 0.4 MG SL tablet Place 1 tablet (0.4 mg total) under the tongue every 5 (five) minutes as needed for chest pain. 25 tablet 1   No current facility-administered medications for this visit.    Allergies   Allergies as of 12/26/2024   (No Known Allergies)    Past Medical History   Past Medical History:  Diagnosis Date   Arthritis    Atrial fibrillation with RVR (HCC) 01/01/2020   CAD (coronary artery disease)    Cardiomyopathy (HCC)    tachycardia induced, EF normalized after afib ablation in 2021.   Eczema    Hypertension     Past Surgical History   Past Surgical History:  Procedure Laterality Date   ATRIAL FIBRILLATION ABLATION N/A 03/13/2020   Procedure: ATRIAL FIBRILLATION ABLATION;  Surgeon: Inocencio Soyla Lunger, MD;  Location: [facility] INVASIVE [facility] LAB;  Service: Cardiovascular;  Laterality: N/A;   BUBBLE STUDY  01/01/2020   Procedure: BUBBLE STUDY;  Surgeon: Loni Soyla LABOR, MD;  Location: [facility] ENDOSCOPY;  Service: Cardiovascular;;   CARDIOVERSION N/A 01/01/2020   Procedure: CARDIOVERSION;  Surgeon: Loni Soyla LABOR, MD;  Location: [facility] ENDOSCOPY;  Service: Cardiovascular;  Laterality: N/A;   HAND SURGERY     KNEE SURGERY  TEE WITHOUT CARDIOVERSION N/A 01/01/2020   Procedure: TRANSESOPHAGEAL ECHOCARDIOGRAM (TEE);  Surgeon: Loni Soyla LABOR, MD;  Location: [facility] ENDOSCOPY;  Service: Cardiovascular;  Laterality: N/A;    Past Family History   Family History  Problem Relation Age of Onset   Atrial fibrillation Brother    Colon cancer Neg Hx     Past Social History   Social History   Socioeconomic History   Marital status: Married    Spouse name: Not on file   Number of children: Not on file   Years of education: Not on file   Highest education level: Not on file  Occupational History   Not on file   Tobacco Use   Smoking status: Former   Smokeless tobacco: Never  Vaping Use   Vaping status: Never Used  Substance and Sexual Activity   Alcohol use: Yes    Comment: occ   Drug use: Never   Sexual activity: Not on file  Other Topics Concern   Not on file  Social History Narrative   Not on file   Social Drivers of Health   Tobacco Use: Medium Risk (12/26/2024)   Patient History    Smoking Tobacco Use: Former    Smokeless Tobacco Use: Never    Passive Exposure: Not on Actuary Strain: Not on file  Food Insecurity: Not on file  Transportation Needs: Not on file  Physical Activity: Not on file  Stress: Not on file  Social Connections: Not on file  Intimate Partner Violence: Not on file  Depression (EYV7-0): Not on file  Alcohol Screen: Not on file  Housing: Not on file  Utilities: Not on file  Health Literacy: Not on file    Review of Systems   General: Negative for anorexia, weight loss, fever, chills, fatigue, weakness. Eyes: Negative for vision changes.  ENT: Negative for hoarseness, difficulty swallowing , nasal congestion. CV: Negative for chest pain, angina, palpitations, dyspnea on exertion, peripheral edema.  Respiratory: Negative for dyspnea at rest, dyspnea on exertion, cough, sputum, wheezing.  GI: See history of present illness. GU:  Negative for dysuria, hematuria, urinary incontinence, urinary frequency, nocturnal urination.  MS: Negative for joint pain, low back pain.  Derm: Negative for rash or itching.  Neuro: Negative for weakness, abnormal sensation, seizure, frequent headaches, memory loss,  confusion.  Psych: Negative for anxiety, depression, suicidal ideation, hallucinations.  Endo: Negative for unusual weight change.  Heme: Negative for bruising or bleeding. Allergy: Negative for rash or hives.  Physical Exam   BP 130/76   Pulse 74   Temp 98.7 F (37.1 C)   Ht 5' 6 (1.676 m)   Wt 284 lb 3.2 oz (128.9 kg)   BMI 45.87  kg/m    General: Well-nourished, well-developed in no acute distress.  Head: Normocephalic, atraumatic.   Eyes: Conjunctiva pink, no icterus. Mouth: Oropharyngeal mucosa moist and pink  Neck: Supple without thyromegaly, masses, or lymphadenopathy.  Lungs: Clear to auscultation bilaterally.  Heart: Regular rate and rhythm, no murmurs rubs or gallops.  Abdomen: Bowel sounds are normal, nontender, nondistended, no hepatosplenomegaly or masses,  no abdominal bruits or hernia, no rebound or guarding.   Rectal: not performed Extremities: No lower extremity edema. No clubbing or deformities.  Neuro: Alert and oriented x 4 , grossly normal neurologically.  Skin: Warm and dry, no rash or jaundice.   Psych: Alert and cooperative, normal mood and affect.  Labs   See above  Imaging Studies   No results  found.  Assessment/Plan:    Assessment & Plan Colorectal cancer screening, average risk -No prior colonoscopy. No prior colon cancer screening -Colonoscopy in the near future. ASA 3.  I have discussed the risks, alternatives, benefits with regards to but not limited to the risk of reaction to medication, bleeding, infection, perforation and the patient is agreeable to proceed. Written consent to be obtained. -We will request he hold Eliquis  48 hours before procedure, we will send clearance to hold medication to his cardiologist. He has appt tomorrow with Josefa Beauvais, NP.       Sonny RAMAN. Ezzard, MHS, PA-C [facility]  "

## 2024-12-26 NOTE — Progress Notes (Unsigned)
 "   Cardiology Clinic Note   Patient Name: Danny Mccormick Date of Encounter: 12/27/2024  Primary Care Provider:  Doroteo Deward JONETTA, FNP Primary Cardiologist:  Jerel Balding, MD  Patient Profile    Danny Mccormick 69 year old male presents to the clinic today for follow-up evaluation of his atrial flutter/A-fib, combined systolic and diastolic CHF and essential hypertension.  Past Medical History    Past Medical History:  Diagnosis Date   Arthritis    Atrial fibrillation with RVR (HCC) 01/01/2020   CAD (coronary artery disease)    Cardiomyopathy (HCC)    tachycardia induced, EF normalized after afib ablation in 2021.   Eczema    Hypertension    Past Surgical History:  Procedure Laterality Date   ATRIAL FIBRILLATION ABLATION N/A 03/13/2020   Procedure: ATRIAL FIBRILLATION ABLATION;  Surgeon: Inocencio Soyla Lunger, MD;  Location: [facility] INVASIVE CV LAB;  Service: Cardiovascular;  Laterality: N/A;   BUBBLE STUDY  01/01/2020   Procedure: BUBBLE STUDY;  Surgeon: Loni Soyla LABOR, MD;  Location: [facility] ENDOSCOPY;  Service: Cardiovascular;;   CARDIOVERSION N/A 01/01/2020   Procedure: CARDIOVERSION;  Surgeon: Loni Soyla LABOR, MD;  Location: [facility] ENDOSCOPY;  Service: Cardiovascular;  Laterality: N/A;   HAND SURGERY     KNEE SURGERY     TEE WITHOUT CARDIOVERSION N/A 01/01/2020   Procedure: TRANSESOPHAGEAL ECHOCARDIOGRAM (TEE);  Surgeon: Loni Soyla LABOR, MD;  Location: [facility] ENDOSCOPY;  Service: Cardiovascular;  Laterality: N/A;    Allergies  No Known Allergies  History of Present Illness    Danny Mccormick has a PMH of atrial flutter with RVR, HTN, A-fib, chronic combined systolic and diastolic CHF, coronary atherosclerosis, chronic anticoagulation, and hypokalemia.  EF 30-35% 1/21.  He follows with Dr. Inocencio for atrial fibrillation and is status post successful ablation 3/21.  Echocardiogram 5/21 showed complete normalization of LV function.  During preevaluation for ablation he underwent  CT which showed severely elevated calcium  score placing him in the 95 percentile.  He denied symptoms of coronary disease.  He was taken off antiarrhythmic therapy and was continued on metoprolol .  He was seen in follow-up by Dr. Balding on 12/03/2021.  During that time he continued to be stable.  He denied recurrence of atrial fibrillation.  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  He denied chest pain and shortness of breath, PND and palpitations.  He did occasionally note mild lower extremity swelling around his ankles at the end of the day.  He denied falls, injuries, bleeding issues.  He did note blood from his mouth when he would wake up in the morning (was noted to have several issues with his teeth and gums).  He denied neurological complaints.  He presented to the clinic 12/28/22 for follow-up evaluation and stated he felt well.  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  We reviewed his prior cardiac history and he expressed understanding.  He denied chest pain or shortness of breath.  He reported that he ran out of his amlodipine  the prior.  We reviewed his nitroglycerin  and recommended use.  He expressed understanding.  His EKG showed sinus rhythm with premature atrial complexes.  Initially his blood pressure was 150/84 and on recheck was 128/76.  He reported that he was following a low-sodium diet.  I ordered a CBC, BMP, fasting lipids and LFTs.   Follow-up in 12 months was planned.  He presented to the clinic 12/23/23 for follow-up  evaluation and stated he continued to do well.  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  His blood pressure initially was 152/86 and on recheck was 136/78.  We reviewed his lab.  I planned repeat  lipids, CBC and CMP 3/25.  He had reduced the carbohydrates in his diet and had been losing weight.  I   continued his medication regimen and planned follow-up in 12 months.  He presents to the clinic today for follow-up evaluation.  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  He continues to have 37 head.  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  Initially his blood pressure today is 150/88 and on recheck is 136/84.  He reports that he has been eating a lower carbohydrate diet and been losing weight.  He had lab work done in September or October with his PCP.  I will request this.  He does have upcoming colonoscopy.  EKG is unchanged from previous.  I will plan follow-up in 12 months.  Today he denies chest pain, shortness of breath, lower extremity edema, fatigue, palpitations, melena, hematuria, hemoptysis, diaphoresis, weakness, presyncope, syncope, orthopnea, and PND.    Home Medications    Prior to Admission medications   Medication Sig Start Date End Date Taking? Authorizing Provider  acetaminophen  (TYLENOL ) 500 MG tablet Take 500-1,000 mg by mouth every 6 (six) hours as needed for moderate pain.    [provider]  amLODipine  (NORVASC ) 5 MG tablet Take 2 tablets (10 mg total) by mouth daily. 12/08/22   Croitoru, Mihai, MD  apixaban  (ELIQUIS ) 5 MG TABS tablet Take 1 tablet (5 mg total) by mouth 2 (two) times daily. 09/25/22   Croitoru, Mihai, MD  Apremilast (OTEZLA) 30 MG TABS Take 30 mg by mouth 2 (two) times daily.    [provider]  atorvastatin  (LIPITOR) 40 MG tablet Take 1 tablet by mouth once daily 05/29/22   Croitoru, Mihai, MD  losartan  (COZAAR ) 100 MG tablet Take 1 tablet (100 mg total) by mouth daily. 12/08/22   Croitoru, Mihai, MD  metoprolol  succinate (TOPROL -XL) 100 MG 24 hr tablet TAKE 1/2 (ONE-HALF) TABLET BY MOUTH ONCE DAILY WITH  OR  IMMEDIATELY  FOLLOWING  A  MEAL 12/24/22   Croitoru, Mihai, MD  nitroGLYCERIN  (NITROSTAT ) 0.4 MG SL tablet Place 1 tablet (0.4 mg total) under the tongue every 5  (five) minutes as needed for chest pain. 01/03/20 04/11/20  Kroeger, Bruno HERO., PA-C    Family History    Family History  Problem Relation Age of Onset   Atrial fibrillation Brother    Colon cancer Neg Hx    He indicated that his brother is alive. He indicated that the status of his neg hx is unknown.  Social History    Social History   Socioeconomic History   Marital status: Married    Spouse name: Not on file   Number of children: Not on file   Years of education: Not on file   Highest education level: Not on file  Occupational History   Not on file  Tobacco Use   Smoking status: Former   Smokeless tobacco: Never  Vaping Use   Vaping status: Never Used  Substance and Sexual Activity   Alcohol use: Yes    Comment: occ   Drug use: Never  Sexual activity: Not on file  Other Topics Concern   Not on file  Social History Narrative   Not on file   Social Drivers of Health   Tobacco Use: Medium Risk (12/27/2024)   Patient History    Smoking Tobacco Use: Former    Smokeless Tobacco Use: Never    Passive Exposure: Not on Actuary Strain: Not on file  Food Insecurity: Not on file  Transportation Needs: Not on file  Physical Activity: Not on file  Stress: Not on file  Social Connections: Not on file  Intimate Partner Violence: Not on file  Depression (EYV7-0): Not on file  Alcohol Screen: Not on file  Housing: Not on file  Utilities: Not on file  Health Literacy: Not on file     Review of Systems    General:  No chills, fever, night sweats or weight changes.  Cardiovascular:  No chest pain, dyspnea on exertion, edema, orthopnea, palpitations, paroxysmal nocturnal dyspnea. Dermatological: No rash, lesions/masses Respiratory: No cough, dyspnea Urologic: No hematuria, dysuria Abdominal:   No nausea, vomiting, diarrhea, bright red blood per rectum, melena, or hematemesis Neurologic:  No visual changes, wkns, changes in mental status. All other  systems reviewed and are otherwise negative except as noted above.  Physical Exam    VS:  BP 136/84   Pulse 74   Ht 5' 6 (1.676 m)   Wt 282 lb 3.2 oz (128 kg)   SpO2 98%   BMI 45.55 kg/m  , BMI Body mass index is 45.55 kg/m. GEN: Well nourished, well developed, in no acute distress. HEENT: normal. Neck: Supple, no JVD, carotid bruits, or masses. Cardiac: RRR, no murmurs, rubs, or gallops. No clubbing, cyanosis, edema.  Radials/DP/PT 2+ and equal bilaterally.  Respiratory:  Respirations regular and unlabored, clear to auscultation bilaterally. GI: Soft, nontender, nondistended, BS + x 4. MS: no deformity or atrophy. Skin: warm and dry, no rash. Neuro:  Strength and sensation are intact. Psych: Normal affect.  Accessory Clinical Findings    Recent Labs: 02/14/2024: ALT 18; BUN 20; Creatinine, Ser 0.97; Hemoglobin 13.9; Platelets 247; Potassium 5.6; Sodium 141   Recent Lipid Panel    Component Value Date/Time   CHOL 147 02/14/2024 0919   TRIG 83 02/14/2024 0919   HDL 50 02/14/2024 0919   CHOLHDL 2.9 02/14/2024 0919   LDLCALC 81 02/14/2024 0919         ECG personally reviewed by me today-EKG Interpretation Date/Time:  Wednesday December 27 2024 09:13:26 EST Ventricular Rate:  74 PR Interval:  192 QRS Duration:  76 QT Interval:  400 QTC Calculation: 444 R Axis:   40  Text Interpretation: Normal sinus rhythm Normal ECG When compared with ECG of 23-Dec-2023 11:14, No significant change was found Confirmed by Emelia Hazy [phone]) on 12/27/2024 9:14:34 AM   EKG 12/28/22 sinus rhythm with premature atrial complexes 73 bpm- No acute changes  Echocardiogram 04/17/2020  IMPRESSIONS     1. Left ventricular ejection fraction, by estimation, is 55 to 60%. The  left ventricle has normal function. The left ventricle has no regional  wall motion abnormalities. There is mild concentric left ventricular  hypertrophy. Left ventricular diastolic  parameters are indeterminate.    2. The mitral valve is normal in structure. Trivial mitral valve  regurgitation. No evidence of mitral stenosis.   3. The aortic valve is grossly normal. Aortic valve regurgitation is not  visualized. No aortic stenosis is present.   Comparison(s): EF improved from prior  study.   FINDINGS   Left Ventricle: Left ventricular ejection fraction, by estimation, is 55  to 60%. The left ventricle has normal function. The left ventricle has no  regional wall motion abnormalities. The left ventricular internal cavity  size was normal in size. There is   mild concentric left ventricular hypertrophy.   Left Atrium: Left atrial size was not assessed.   Right Atrium: Right atrial size was not assessed.   Pericardium: There is no evidence of pericardial effusion.   Mitral Valve: The mitral valve is normal in structure. Trivial mitral  valve regurgitation. No evidence of mitral valve stenosis.   Tricuspid Valve: The tricuspid valve is normal in structure. Tricuspid  valve regurgitation is trivial. No evidence of tricuspid stenosis.   Aortic Valve: The aortic valve is grossly normal. Aortic valve  regurgitation is not visualized. No aortic stenosis is present.   Pulmonic Valve: The pulmonic valve was not well visualized. Pulmonic valve  regurgitation is not visualized. No evidence of pulmonic stenosis.   Aorta: The aortic root and ascending aorta are structurally normal, with  no evidence of dilitation.   Venous: The inferior vena cava was not well visualized.   IAS/Shunts: No atrial level shunt detected by color flow Doppler.    Assessment & Plan   1. Persistent atrial fibrillation-EKG today shows normal sinus rhythm 74bpm.  He is status post ablation 3/21 and follows with EP.  He denies bleeding issues on apixaban . Continue apixaban  Maintain physical activity Avoid triggers caffeine, chocolate, EtOH, dehydration etc.-reviewed Request labs from PCP   Tachycardia induced  cardiomyopathy-continues to be physically active.  No increased DOE or activity intolerance.  Normal EF status post successful ablation 3/21.  He continues to farm.   Continue losartan , metoprolol , amlodipine  Heart healthy low-sodium diet-reviewed  Essential hypertension-BP today  136/84.  Previously noted whitecoat hypertension. Maintain blood pressure log Continue current medical therapy Maintain blood pressure log  Coronary atherosclerosis-denies exertional chest discomfort.  Noted to have coronary atherosclerosis on CT prior to A-fib ablation. Continue amlodipine , metoprolol , atorvastatin  Heart healthy low-sodium high-fiber diet  Hyperlipidemia- 02/14/2024: Cholesterol, Total 147; HDL 50; LDL Chol Calc (NIH) 81; Triglycerides 83 .  No aspirin due to apixaban  Heart healthy low-sodium high-fiber diet Continue atorvastatin  Follows with PCP  Preoperative cardiac evaluation-colonoscopy, [facility]ology at [facility], fax #[phone]    Primary Cardiologist: Jerel Balding, MD  Chart reviewed as part of pre-operative protocol coverage. Given past medical history and time since last visit, based on ACC/AHA guidelines, SHERWOOD CASTILLA would be at acceptable risk for the planned procedure without further cardiovascular testing.   Patient was advised that if he develops new symptoms prior to surgery to contact our office to arrange a follow-up appointment.  He verbalized understanding.  Per office protocol, patient can hold Eliquis  for 2 days prior to procedure.     I will route this recommendation to the requesting party via Epic fax function and remove from pre-op pool.       Disposition: Follow-up with Dr. Balding or me in 12 months.   Josefa HERO. Chlora Mcbain NP-C     12/27/2024, 9:57 AM [facility] [address] Office [phone] Fax [phone]    I spent 14 minutes examining this patient, reviewing medications, and  using patient centered shared decision making involving their cardiac care.  Prior to his visit I spent greater than 20 minutes reviewing past medical history,  medications, and prior cardiac tests.  "

## 2024-12-26 NOTE — Patient Instructions (Signed)
 Colonoscopy to be scheduled once clearance from cardiology to hold Eliquis  48 hours prior to procedure.   Please mention to your cardiologist tomorrow at your appointment regarding need to hold Eliquis .

## 2024-12-27 ENCOUNTER — Ambulatory Visit: Admitting: General Practice

## 2024-12-27 ENCOUNTER — Encounter: Payer: Self-pay | Admitting: General Practice

## 2024-12-27 ENCOUNTER — Ambulatory Visit: Attending: Cardiology | Admitting: General Practice

## 2024-12-27 VITALS — BP 136/84 | HR 74 | Ht 66.0 in | Wt 282.2 lb

## 2024-12-27 DIAGNOSIS — L409 Psoriasis, unspecified: Secondary | ICD-10-CM | POA: Insufficient documentation

## 2024-12-27 DIAGNOSIS — L4050 Arthropathic psoriasis, unspecified: Secondary | ICD-10-CM | POA: Insufficient documentation

## 2024-12-27 DIAGNOSIS — I5042 Chronic combined systolic (congestive) and diastolic (congestive) heart failure: Secondary | ICD-10-CM | POA: Insufficient documentation

## 2024-12-27 DIAGNOSIS — E785 Hyperlipidemia, unspecified: Secondary | ICD-10-CM | POA: Insufficient documentation

## 2024-12-27 DIAGNOSIS — I4819 Other persistent atrial fibrillation: Secondary | ICD-10-CM | POA: Insufficient documentation

## 2024-12-27 DIAGNOSIS — E669 Obesity, unspecified: Secondary | ICD-10-CM | POA: Insufficient documentation

## 2024-12-27 DIAGNOSIS — E875 Hyperkalemia: Secondary | ICD-10-CM | POA: Insufficient documentation

## 2024-12-27 DIAGNOSIS — Z01818 Encounter for other preprocedural examination: Secondary | ICD-10-CM | POA: Diagnosis not present

## 2024-12-27 DIAGNOSIS — I2510 Atherosclerotic heart disease of native coronary artery without angina pectoris: Secondary | ICD-10-CM | POA: Diagnosis present

## 2024-12-27 DIAGNOSIS — I872 Venous insufficiency (chronic) (peripheral): Secondary | ICD-10-CM | POA: Insufficient documentation

## 2024-12-27 MED ORDER — METOPROLOL SUCCINATE ER 100 MG PO TB24: ORAL_TABLET | ORAL | 3 refills | Status: AC

## 2024-12-27 NOTE — Patient Instructions (Signed)
 Medication Instructions:  NO CHANGES *If you need a refill on your cardiac medications before your next appointment, please call your pharmacy*  Lab Work: NO LABS If you have labs (blood work) drawn today and your tests are completely normal, you will receive your results only by: MyChart Message (if you have MyChart) OR A paper copy in the mail If you have any lab test that is abnormal or we need to change your treatment, we will call you to review the results.  Testing/Procedures: NO TESTING  Follow-Up: At [facility], you and your health needs are our priority.  As part of our continuing mission to provide you with exceptional heart care, our providers are all part of one team.  This team includes your primary Cardiologist (physician) and Advanced Practice Providers or APPs (Physician Assistants and Nurse Practitioners) who all work together to provide you with the care you need, when you need it.  Your next appointment:   1 year(s)  Provider:   Jerel Balding, MD or Danny Mccormick    Other Instructions

## 2024-12-27 NOTE — Telephone Encounter (Signed)
 Patient with diagnosis of A Fib on Eliquis  for anticoagulation.    Procedure: colonoscopy Date of procedure: TBD   CHA2DS2-VASc Score = 4  This indicates a 4.8% annual risk of stroke. The patient's score is based upon: CHF History: 1 HTN History: 1 Diabetes History: 0 Stroke History: 0 Vascular Disease History: 1 Age Score: 1 Gender Score: 0     CrCl 128 ml/min Platelet count 247k  Patient  has not had an Afib/aflutter ablation in the last 3 months, DCCV within the last 4 weeks or a watchman implanted in the last 45 days    Per office protocol, patient can hold Eliquis  for 2 days prior to procedure.    **This guidance is not considered finalized until pre-operative APP has relayed final recommendations.**

## 2024-12-27 NOTE — Telephone Encounter (Signed)
 Pt had a preoperative clearance visit today.

## 2024-12-28 NOTE — Telephone Encounter (Signed)
 Ok to schedule. Hold eliquis 48 hours.

## 2024-12-28 NOTE — Telephone Encounter (Signed)
 LMOVM to return call  TCS w/Dr.Rourk, asa 3

## 2025-01-02 ENCOUNTER — Encounter: Payer: Self-pay | Admitting: *Deleted

## 2025-01-02 ENCOUNTER — Other Ambulatory Visit: Payer: Self-pay | Admitting: *Deleted

## 2025-01-02 MED ORDER — PEG 3350-KCL-NA BICARB-NACL 420 G PO SOLR: 4000.0000 mL | Freq: Once | ORAL | 0 refills | Status: DC

## 2025-01-02 NOTE — Telephone Encounter (Signed)
 Pt has been scheduled for 01/12/25, instructions sent via mychart and prep sent to pharmacy.

## 2025-01-03 ENCOUNTER — Other Ambulatory Visit: Payer: Self-pay | Admitting: Cardiovascular Disease

## 2025-01-04 ENCOUNTER — Other Ambulatory Visit: Payer: Self-pay

## 2025-01-04 ENCOUNTER — Encounter (HOSPITAL_COMMUNITY): Payer: Self-pay

## 2025-01-04 ENCOUNTER — Other Ambulatory Visit: Payer: Self-pay | Admitting: *Deleted

## 2025-01-04 MED ORDER — PEG 3350-KCL-NA BICARB-NACL 420 G PO SOLR: 4000.0000 mL | Freq: Once | ORAL | 0 refills | Status: AC

## 2025-01-08 ENCOUNTER — Encounter (HOSPITAL_COMMUNITY)
Admission: RE | Admit: 2025-01-08 | Discharge: 2025-01-08 | Disposition: A | Discharge status: Home / Self Care | Source: Ambulatory Visit | Attending: Internal Medicine | Admitting: Internal Medicine

## 2025-01-12 ENCOUNTER — Ambulatory Visit (HOSPITAL_COMMUNITY)
Admission: RE | Admit: 2025-01-12 | Discharge: 2025-01-12 | Disposition: A | Discharge status: Home / Self Care | Attending: Internal Medicine | Admitting: Internal Medicine

## 2025-01-12 ENCOUNTER — Ambulatory Visit (HOSPITAL_COMMUNITY): Admitting: Anesthesiology

## 2025-01-12 ENCOUNTER — Encounter (HOSPITAL_COMMUNITY)
Admission: RE | Disposition: A | Discharge status: Home / Self Care | Payer: Self-pay | Source: Home / Self Care | Attending: Internal Medicine

## 2025-01-12 DIAGNOSIS — I2510 Atherosclerotic heart disease of native coronary artery without angina pectoris: Secondary | ICD-10-CM | POA: Diagnosis not present

## 2025-01-12 DIAGNOSIS — D125 Benign neoplasm of sigmoid colon: Secondary | ICD-10-CM | POA: Insufficient documentation

## 2025-01-12 DIAGNOSIS — D124 Benign neoplasm of descending colon: Secondary | ICD-10-CM | POA: Insufficient documentation

## 2025-01-12 DIAGNOSIS — D122 Benign neoplasm of ascending colon: Secondary | ICD-10-CM | POA: Insufficient documentation

## 2025-01-12 DIAGNOSIS — Z1211 Encounter for screening for malignant neoplasm of colon: Secondary | ICD-10-CM | POA: Insufficient documentation

## 2025-01-12 DIAGNOSIS — I10 Essential (primary) hypertension: Secondary | ICD-10-CM | POA: Insufficient documentation

## 2025-01-12 DIAGNOSIS — Z87891 Personal history of nicotine dependence: Secondary | ICD-10-CM | POA: Insufficient documentation

## 2025-01-12 DIAGNOSIS — K5730 Diverticulosis of large intestine without perforation or abscess without bleeding: Secondary | ICD-10-CM | POA: Diagnosis not present

## 2025-01-12 MED ORDER — LACTATED RINGERS IV SOLN: INTRAVENOUS | Status: DC

## 2025-01-12 MED ORDER — LACTATED RINGERS IV SOLN: INTRAVENOUS | Status: DC | PRN

## 2025-01-12 MED ORDER — PROPOFOL 10 MG/ML IV BOLUS
INTRAVENOUS | Status: DC | PRN
  Administered 2025-01-12: 100 mg via INTRAVENOUS

## 2025-01-12 MED ORDER — PROPOFOL 500 MG/50ML IV EMUL
INTRAVENOUS | Status: DC | PRN
  Administered 2025-01-12: 200 ug/kg/min via INTRAVENOUS

## 2025-01-12 MED ORDER — GLUCAGON HCL RDNA (DIAGNOSTIC) 1 MG IJ SOLR
INTRAMUSCULAR | Status: DC | PRN
  Administered 2025-01-12: 1 mg via INTRAVENOUS

## 2025-01-12 NOTE — Transfer of Care (Signed)
 Immediate Anesthesia Transfer of Care Note  Patient: Danny Mccormick  Procedure(s) Performed: COLONOSCOPY POLYPECTOMY, INTESTINE  Patient Location: Short Stay  Anesthesia Type:MAC  Level of Consciousness: awake, alert , oriented, and patient cooperative  Airway & Oxygen Therapy: Patient Spontanous Breathing  Post-op Assessment: Report given to RN, Post -op Vital signs reviewed and stable, and Patient moving all extremities X 4  Post vital signs: Reviewed and stable  Last Vitals:  Vitals Value Taken Time  BP 119/85 01/12/25 12:32  Temp 36.4 C 01/12/25 12:32  Pulse 76 01/12/25 12:32  Resp 19 01/12/25 12:32  SpO2 95 % 01/12/25 12:32    Last Pain:  Vitals:   01/12/25 1232  TempSrc: Axillary  PainSc: 0-No pain      Patients Stated Pain Goal: 6 (01/12/25 1051)  Complications: No notable events documented.

## 2025-01-12 NOTE — Interval H&P Note (Signed)
 History and Physical Interval Note:  01/12/2025 11:40 AM  Danny Mccormick  has presented today for surgery, with the diagnosis of screening.  The various methods of treatment have been discussed with the patient and family. After consideration of risks, benefits and other options for treatment, the patient has consented to  Procedures with comments: COLONOSCOPY (N/A) - 1:00 pm, asa 3 as a surgical intervention.  The patient's history has been reviewed, patient examined, no change in status, stable for surgery.  I have reviewed the patient's chart and labs.  Questions were answered to the patient's satisfaction.     Lamar Hollingshead    Patient here for first-ever colonoscopy for screening.  No change.  Eliquis  held. The risks, benefits, limitations, alternatives and imponderables have been reviewed with the patient. Questions have been answered. All parties are agreeable.

## 2025-01-12 NOTE — Anesthesia Preprocedure Evaluation (Signed)
"                                    Anesthesia Evaluation  Patient identified by MRN, date of birth, ID band Patient awake    Reviewed: Allergy & Precautions, H&P , NPO status , Patient's Chart, lab work & pertinent test results, reviewed documented beta blocker date and time   Airway Mallampati: II  TM Distance: >3 FB Neck ROM: full    Dental no notable dental hx.    Pulmonary neg pulmonary ROS, former smoker   Pulmonary exam normal breath sounds clear to auscultation       Cardiovascular Exercise Tolerance: Good hypertension, + CAD   Rhythm:regular Rate:Normal     Neuro/Psych negative neurological ROS  negative psych ROS   GI/Hepatic negative GI ROS, Neg liver ROS,,,  Endo/Other  negative endocrine ROS    Renal/GU negative Renal ROS  negative genitourinary   Musculoskeletal   Abdominal   Peds  Hematology negative hematology ROS (+)   Anesthesia Other Findings   Reproductive/Obstetrics negative OB ROS                              Anesthesia Physical Anesthesia Plan  ASA: 3  Anesthesia Plan: MAC   Post-op Pain Management:    Induction:   PONV Risk Score and Plan: Propofol  infusion  Airway Management Planned:   Additional Equipment:   Intra-op Plan:   Post-operative Plan:   Informed Consent: I have reviewed the patients History and Physical, chart, labs and discussed the procedure including the risks, benefits and alternatives for the proposed anesthesia with the patient or authorized representative who has indicated his/her understanding and acceptance.     Dental Advisory Given  Plan Discussed with: CRNA  Anesthesia Plan Comments:         Anesthesia Quick Evaluation  "

## 2025-01-12 NOTE — Op Note (Signed)
 [facility] Patient Name: Danny Mccormick Procedure Date: 01/12/2025 11:30 AM MRN: [id number] Date of Birth: 10/04/1956 Attending MD: Lamar Ozell Hollingshead , MD, [id number] CSN: [id number] Age: 69 Admit Type: Outpatient Procedure:                Colonoscopy Indications:              Screening for colorectal malignant neoplasm Providers:                Lamar Ozell Hollingshead, MD, Rosina Sprague, Daphne Mulch                            Technician, Technician Referring MD:              Medicines:                Propofol  per Anesthesia Complications:            No immediate complications. Estimated Blood Loss:     Estimated blood loss was minimal. Procedure:                Pre-Anesthesia Assessment:                           - Prior to the procedure, a History and Physical                            was performed, and patient medications and                            allergies were reviewed. The patient's tolerance of                            previous anesthesia was also reviewed. The risks                            and benefits of the procedure and the sedation                            options and risks were discussed with the patient.                            All questions were answered, and informed consent                            was obtained. Prior Anticoagulants: The patient has                            taken no anticoagulant or antiplatelet agents. ASA                            Grade Assessment: III - A patient with severe                            systemic disease. After reviewing the risks and  benefits, the patient was deemed in satisfactory                            condition to undergo the procedure.                           After obtaining informed consent, the colonoscope                            was passed under direct vision. Throughout the                            procedure, the patient's blood pressure, pulse, and                             oxygen saturations were monitored continuously. The                            [id number] ([id number]) Colon was introduced through                            the anus and advanced to the the cecum, identified                            by appendiceal orifice and ileocecal valve. Scope In: 11:53:37 AM Scope Out: 12:25:35 PM Scope Withdrawal Time: 0 hours 22 minutes 45 seconds  Total Procedure Duration: 0 hours 31 minutes 58 seconds  Findings:      The perianal and digital rectal examinations were normal.      Scattered medium-mouthed diverticula were found in the sigmoid colon and       descending colon.      Eight semi-pedunculated polyps were found in the sigmoid colon,       descending colon and ascending colon. The polyps were 4 to 9 mm in size.       These polyps were removed with a hot and cold snare. Resection and       retrieval were complete. Estimated blood loss was minimal.      The exam was otherwise without abnormality on direct and retroflexion       views. Impression:               - Diverticulosis in the sigmoid colon and in the                            descending colon.                           - Eight 4 to 9 mm polyps in the sigmoid colon, in                            the descending colon and in the ascending colon,                            removed with a hot and cold snare. Resected and  retrieved.                           - The examination was otherwise normal on direct                            and retroflexion views. Moderate Sedation:      Moderate (conscious) sedation was personally administered by an       anesthesia professional. The following parameters were monitored: oxygen       saturation, heart rate, blood pressure, respiratory rate, EKG, adequacy       of pulmonary ventilation, and response to care. Recommendation:           - Patient has a contact number available for                             emergencies. The signs and symptoms of potential                            delayed complications were discussed with the                            patient. Return to normal activities tomorrow.                            Written discharge instructions were provided to the                            patient.                           - Advance diet as tolerated.                           - Continue present medications.                           - Repeat colonoscopy date to be determined after                            pending pathology results are reviewed for                            surveillance.                           - Return to GI office (date not yet determined). Procedure Code(s):        --- Professional ---                           [phone], Colonoscopy, flexible; with removal of                            tumor(s), polyp(s), or other lesion(s) by snare  technique Diagnosis Code(s):        --- Professional ---                           Z12.11, Encounter for screening for malignant                            neoplasm of colon                           D12.5, Benign neoplasm of sigmoid colon                           D12.4, Benign neoplasm of descending colon                           D12.2, Benign neoplasm of ascending colon                           K57.30, Diverticulosis of large intestine without                            perforation or abscess without bleeding CPT copyright 2022 [employer] Association. All rights reserved. The codes documented in this report are preliminary and upon coder review may  be revised to meet current compliance requirements. Lamar HERO. Maxtyn Nuzum, MD Lamar Ozell Hollingshead, MD 01/12/2025 12:33:55 PM This report has been signed electronically. Number of Addenda: 0

## 2025-01-12 NOTE — Discharge Instructions (Signed)
" °  Colonoscopy Discharge Instructions  Read the instructions outlined below and refer to this sheet in the next few weeks. These discharge instructions provide you with general information on caring for yourself after you leave the hospital. Your doctor may also give you specific instructions. While your treatment has been planned according to the most current medical practices available, unavoidable complications occasionally occur. If you have any problems or questions after discharge, call Dr. Shaaron at [phone]. ACTIVITY You may resume your regular activity, but move at a slower pace for the next 24 hours.  Take frequent rest periods for the next 24 hours.  Walking will help get rid of the air and reduce the bloated feeling in your belly (abdomen).  No driving for 24 hours (because of the medicine (anesthesia) used during the test).   Do not sign any important legal documents or operate any machinery for 24 hours (because of the anesthesia used during the test).  NUTRITION Drink plenty of fluids.  You may resume your normal diet as instructed by your doctor.  Begin with a light meal and progress to your normal diet. Heavy or fried foods are harder to digest and may make you feel sick to your stomach (nauseated).  Avoid alcoholic beverages for 24 hours or as instructed.  MEDICATIONS You may resume your normal medications unless your doctor tells you otherwise.  WHAT YOU CAN EXPECT TODAY Some feelings of bloating in the abdomen.  Passage of more gas than usual.  Spotting of blood in your stool or on the toilet paper.  IF YOU HAD POLYPS REMOVED DURING THE COLONOSCOPY: No aspirin products for 7 days or as instructed.  No alcohol for 7 days or as instructed.  Eat a soft diet for the next 24 hours.  FINDING OUT THE RESULTS OF YOUR TEST Not all test results are available during your visit. If your test results are not back during the visit, make an appointment with your caregiver to find out the  results. Do not assume everything is normal if you have not heard from your caregiver or the medical facility. It is important for you to follow up on all of your test results.  SEEK IMMEDIATE MEDICAL ATTENTION IF: You have more than a spotting of blood in your stool.  Your belly is swollen (abdominal distention).  You are nauseated or vomiting.  You have a temperature over 101.  You have abdominal pain or discomfort that is severe or gets worse throughout the day.      8 polyps found and removed from your colon  Diverticulosis also present.    Further recommendations to follow pending review of pathology report   resume Eliquis  February 1   at patient request,  I called Fronie Messier at [phone] -  no contact.  "

## 2025-01-13 ENCOUNTER — Encounter (HOSPITAL_COMMUNITY): Payer: Self-pay | Admitting: Internal Medicine

## 2025-01-13 NOTE — Anesthesia Postprocedure Evaluation (Signed)
"   Anesthesia Post Note  Patient: Danny Mccormick  Procedure(s) Performed: COLONOSCOPY POLYPECTOMY, INTESTINE  Patient location during evaluation: Phase II Anesthesia Type: MAC Level of consciousness: awake Pain management: pain level controlled Vital Signs Assessment: post-procedure vital signs reviewed and stable Respiratory status: spontaneous breathing and respiratory function stable Cardiovascular status: blood pressure returned to baseline and stable Postop Assessment: no headache and no apparent nausea or vomiting Anesthetic complications: no Comments: Late entry   No notable events documented.   Last Vitals:  Vitals:   01/12/25 1051 01/12/25 1232  BP: (!) 157/93 119/85  Pulse: 93 76  Resp: 16 19  Temp: 37.1 C 36.4 C  SpO2: 94% 95%    Last Pain:  Vitals:   01/12/25 1232  TempSrc: Axillary  PainSc: 0-No pain                 Yvonna PARAS Analyssa Downs      "

## 2025-01-15 LAB — SURGICAL PATHOLOGY

## 2025-01-16 ENCOUNTER — Ambulatory Visit: Payer: Self-pay | Admitting: Internal Medicine
